# Patient Record
Sex: Female | Born: 1985 | Race: Black or African American | Hispanic: No | Marital: Single | Smoking: Never smoker
Health system: Southern US, Community
[De-identification: ages and names within clinical notes are randomized; demographics above are authoritative.]

## PROBLEM LIST (undated history)

## (undated) DIAGNOSIS — G43909 Migraine, unspecified, not intractable, without status migrainosus: Secondary | ICD-10-CM

## (undated) DIAGNOSIS — J45909 Unspecified asthma, uncomplicated: Secondary | ICD-10-CM

## (undated) DIAGNOSIS — A599 Trichomoniasis, unspecified: Secondary | ICD-10-CM

## (undated) DIAGNOSIS — B009 Herpesviral infection, unspecified: Secondary | ICD-10-CM

## (undated) HISTORY — DX: Trichomoniasis, unspecified: A59.9

## (undated) HISTORY — DX: Unspecified asthma, uncomplicated: J45.909

## (undated) HISTORY — DX: Herpesviral infection, unspecified: B00.9

## (undated) HISTORY — DX: Migraine, unspecified, not intractable, without status migrainosus: G43.909

## (undated) HISTORY — PX: NO PAST SURGERIES: SHX2092

---

## 2009-04-22 ENCOUNTER — Emergency Department (HOSPITAL_COMMUNITY): Admission: EM | Admit: 2009-04-22 | Discharge: 2009-04-23 | Payer: Self-pay | Admitting: Emergency Medicine

## 2016-05-28 LAB — OB RESULTS CONSOLE HGB/HCT, BLOOD
HEMATOCRIT: 12 %
Hemoglobin: 35.9 g/dL

## 2016-05-28 LAB — OB RESULTS CONSOLE HIV ANTIBODY (ROUTINE TESTING): HIV: NONREACTIVE

## 2016-05-28 LAB — OB RESULTS CONSOLE ANTIBODY SCREEN: Antibody Screen: NEGATIVE

## 2016-05-28 LAB — OB RESULTS CONSOLE ABO/RH: RH TYPE: POSITIVE

## 2016-05-28 LAB — OB RESULTS CONSOLE RPR: RPR: NONREACTIVE

## 2016-05-28 LAB — OB RESULTS CONSOLE HEPATITIS B SURFACE ANTIGEN: Hepatitis B Surface Ag: NEGATIVE

## 2016-05-30 LAB — OB RESULTS CONSOLE RUBELLA ANTIBODY, IGM: Rubella: IMMUNE

## 2016-05-30 LAB — URINE CULTURE: URINE CULTURE, ROUTINE: NEGATIVE

## 2016-05-30 LAB — OB RESULTS CONSOLE ABO/RH

## 2016-06-09 LAB — OB RESULTS CONSOLE GC/CHLAMYDIA
CHLAMYDIA, DNA PROBE: NEGATIVE
GC PROBE AMP, GENITAL: NEGATIVE

## 2016-07-16 DIAGNOSIS — O30049 Twin pregnancy, dichorionic/diamniotic, unspecified trimester: Secondary | ICD-10-CM | POA: Insufficient documentation

## 2016-07-16 DIAGNOSIS — D252 Subserosal leiomyoma of uterus: Secondary | ICD-10-CM

## 2016-07-16 DIAGNOSIS — Z8619 Personal history of other infectious and parasitic diseases: Secondary | ICD-10-CM | POA: Insufficient documentation

## 2016-07-17 ENCOUNTER — Encounter: Payer: Self-pay | Admitting: *Deleted

## 2016-07-23 ENCOUNTER — Ambulatory Visit (INDEPENDENT_AMBULATORY_CARE_PROVIDER_SITE_OTHER): Payer: Self-pay | Admitting: Obstetrics and Gynecology

## 2016-07-23 ENCOUNTER — Encounter: Payer: Self-pay | Admitting: Obstetrics and Gynecology

## 2016-07-23 VITALS — BP 116/69 | HR 75 | Ht 61.0 in | Wt 130.6 lb

## 2016-07-23 DIAGNOSIS — Z789 Other specified health status: Secondary | ICD-10-CM

## 2016-07-23 DIAGNOSIS — O30049 Twin pregnancy, dichorionic/diamniotic, unspecified trimester: Secondary | ICD-10-CM

## 2016-07-23 DIAGNOSIS — Z8619 Personal history of other infectious and parasitic diseases: Secondary | ICD-10-CM

## 2016-07-23 DIAGNOSIS — O0992 Supervision of high risk pregnancy, unspecified, second trimester: Secondary | ICD-10-CM

## 2016-07-23 DIAGNOSIS — O30042 Twin pregnancy, dichorionic/diamniotic, second trimester: Secondary | ICD-10-CM

## 2016-07-23 LAB — POCT URINALYSIS DIP (DEVICE)
BILIRUBIN URINE: NEGATIVE
GLUCOSE, UA: NEGATIVE mg/dL
Hgb urine dipstick: NEGATIVE
KETONES UR: NEGATIVE mg/dL
Leukocytes, UA: NEGATIVE
NITRITE: NEGATIVE
PH: 7 (ref 5.0–8.0)
Protein, ur: NEGATIVE mg/dL
Specific Gravity, Urine: 1.01 (ref 1.005–1.030)
Urobilinogen, UA: 0.2 mg/dL (ref 0.0–1.0)

## 2016-07-23 LAB — FOLATE: FOLATE: 17.7 ng/mL (ref >5.4)

## 2016-07-23 LAB — COMPREHENSIVE METABOLIC PANEL
ALBUMIN: 3.5 g/dL — AB (ref 3.6–5.1)
ALT: 17 U/L (ref 6–29)
AST: 23 U/L (ref 10–30)
Alkaline Phosphatase: 37 U/L (ref 33–115)
BUN: 4 mg/dL — ABNORMAL LOW (ref 7–25)
CALCIUM: 8.8 mg/dL (ref 8.6–10.2)
CHLORIDE: 105 mmol/L (ref 98–110)
CO2: 23 mmol/L (ref 20–31)
Creat: 0.46 mg/dL — ABNORMAL LOW (ref 0.50–1.10)
Glucose, Bld: 57 mg/dL — ABNORMAL LOW (ref 65–99)
Potassium: 3.6 mmol/L (ref 3.5–5.3)
Sodium: 138 mmol/L (ref 135–146)
Total Bilirubin: 0.6 mg/dL (ref 0.2–1.2)
Total Protein: 6.3 g/dL (ref 6.1–8.1)

## 2016-07-23 LAB — VITAMIN B12: VITAMIN B 12: 309 pg/mL (ref 200–1100)

## 2016-07-23 MED ORDER — ASPIRIN EC 81 MG PO TBEC: 81.0000 mg | DELAYED_RELEASE_TABLET | Freq: Every day | ORAL | 3 refills | Status: DC

## 2016-07-23 MED ORDER — FOLIC ACID 1 MG PO TABS: 1.0000 mg | ORAL_TABLET | Freq: Every day | ORAL | 3 refills | Status: DC

## 2016-07-23 NOTE — Progress Notes (Signed)
Last pap 2017 Patient has some concerns about HSV.

## 2016-07-23 NOTE — Progress Notes (Signed)
New OB Note  07/23/2016   Clinic: [facility]  Chief Complaint: NOB  Transfer of Care Patient: Yes, [facility]  History of Present Illness: Suzanne Hernandez is a 30 y.o. G1 @ 17/5 weeks ([place] 2/3, based on Patient's last menstrual period was 03/21/2016.=13wk u/s), with the above CC. Preg complicated by has Twin pregnancy, twins dichorionic and diamniotic; Subserous leiomyoma of uterus; History of herpes simplex type 2 infection; and Supervision of high risk pregnancy in second trimester on her problem list.   She has Positive signs or symptoms of nausea/vomiting of pregnancy but they are improving and she is able to take PO w/o difficulty She has Negative signs or symptoms of miscarriage or preterm labor She identifies Negative Zika risk factors for her and her partner  ROS: A 12-point review of systems was performed and negative, except as stated in the above HPI.  OBGYN History: As per HPI. OB History  Gravida Para Term Preterm AB Living  1            SAB TAB Ectopic Multiple Live Births               # Outcome Date GA Lbr Len/2nd Weight Sex Delivery Anes PTL Lv  1 Current               Any issues with any prior pregnancies: not applicable Any prior children are healthy, doing well, without any problems or issues: not applicable History of pap smears: Yes. Last pap smear 2017 per patient.   Past Medical History: Past Medical History:  Diagnosis Date  . Asthma   . Herpes simplex virus (HSV) infection   . Migraine   . Trichimoniasis     Past Surgical History: History reviewed. No pertinent surgical history.  Family History:  Family History  Problem Relation Age of Onset  . Cancer Maternal Grandmother   . Cancer Father   . Hypertension Mother    She denies any female cancers, bleeding or blood clotting disorders.  She denies any history of mental retardation, birth defects or genetic disorders in her or the FOB's history  Social History:  Social History    Social History  . Marital status: Single    Spouse name: N/A  . Number of children: N/A  . Years of education: N/A   Occupational History  . Not on file.   Social History Main Topics  . Smoking status: Never Smoker  . Smokeless tobacco: Never Used  . Alcohol use No  . Drug use: No  . Sexual activity: Yes   Other Topics Concern  . Not on file   Social History Narrative  . No narrative on file    Allergy: No Known Allergies  Health Maintenance:  Mammogram Up to Date: not applicable  Current Outpatient Medications: PNV  Physical Exam:   BP 116/69   Pulse 75   Ht 5' 1"  (1.549 m)   Wt 130 lb 9.6 oz (59.2 kg)   LMP 03/21/2016   BMI 24.68 kg/m  Body mass index is 24.68 kg/m. Fundal height: not applicable FHTs: normal x 2  General appearance: Well nourished, well developed female in no acute distress.  Abdomen: gravid, nttp  Laboratory: A pos/RI/rpr neg/hiv neg/hepB neg/UCx neg/[place] neg/U-a with trace protein  Imaging:  Normal NT scan and analytes x 2, di di twins Bedside u/s: IUP x . subj normal fluid, normal FHR, +FM x 2. Transabdominal cervical length 4cm (done incidentally during FHR u/s)  Assessment: pt  doing well  Plan: 1. Supervision of high risk pregnancy in second trimester Routine care - Pain Mgmt, Profile 6 Conf w/o mM, U - GC/Chlamydia Probe Amp - [place] MFM OB DETAIL ADDL GEST +14 WK; Future - [place] MFM OB DETAIL +14 WK; Future - Alpha fetoprotein, maternal  2. Twin pregnancy, twins dichorionic and diamniotic Baseline pre-x labs and early 1hr GCT. Recommend baby ASA to prevent pre-x and extra 37m FA. - Comp Met (CMET) - Protein / Creatinine Ratio, Urine - [place]FM OB DETAIL ADDL GEST +14 WK; Future - [place]FM OB DETAIL +14 WK; Future - Alpha fetoprotein, maternal - Glucose Tolerance, 1 HR (50g)  3. History of herpes simplex type 2 infection Pt hesistant to use later in pregnancy given no outbreak in >4-5 years. D/w her immunosupp with pregnancy  and to hopefully avoid outbreaks close to possible labor time. Can d/w pt later. Would start at 32wks given twin GA and risk for PTB  4. Vegan - Vitamin B12 - Folate  Problem list reviewed and updated.  Follow up in 3 weeks.  >50% of 25 min visit spent on counseling and coordination of care.     CDurene RomansMD Attending [facility]

## 2016-07-24 LAB — PAIN MGMT, PROFILE 6 CONF W/O MM, U
6 Acetylmorphine: NEGATIVE ng/mL (ref <10)
AMPHETAMINES: NEGATIVE ng/mL (ref <500)
Alcohol Metabolites: NEGATIVE ng/mL (ref <500)
BARBITURATES: NEGATIVE ng/mL (ref <300)
Benzodiazepines: NEGATIVE ng/mL (ref <100)
CREATININE: 5.9 mg/dL — AB (ref >=20.0)
Cocaine Metabolite: NEGATIVE ng/mL (ref <150)
METHADONE METABOLITE: NEGATIVE ng/mL (ref <100)
Marijuana Metabolite: NEGATIVE ng/mL (ref <20)
OPIATES: NEGATIVE ng/mL (ref <100)
OXIDANT: NEGATIVE ug/mL (ref <200)
Oxycodone: NEGATIVE ng/mL (ref <100)
PH: 6.46 (ref 4.5–9.0)
Phencyclidine: NEGATIVE ng/mL (ref <25)
Please note:: 0
Specific Gravity: 1.002 — ABNORMAL LOW (ref >=1.003)

## 2016-07-24 LAB — PROTEIN / CREATININE RATIO, URINE: Creatinine, Urine: 8 mg/dL — ABNORMAL LOW (ref 20–320)

## 2016-07-24 LAB — ALPHA FETOPROTEIN, MATERNAL
AFP: 151 ng/mL
Curr Gest Age: 17.7 weeks
MoM for AFP: 2.93
OPEN SPINA BIFIDA: NEGATIVE

## 2016-07-24 LAB — GLUCOSE TOLERANCE, 1 HOUR (50G) W/O FASTING: GLUCOSE, 1 HR, GESTATIONAL: 58 mg/dL — AB (ref <140)

## 2016-07-29 ENCOUNTER — Telehealth: Payer: Self-pay

## 2016-07-29 NOTE — Telephone Encounter (Signed)
Received call from patient regarding FMLA paperwork. Patient her employer is giving her a hard time with her being pregnant. She wanted to know if we could write her a note stating why she has missed work or being tardy. I explained to patient we cannot take her out of work unless it is medically necessary however she may be able to get an excused if she has missed work or been absent due to complications regarding  Pregnancy.I also explained to patient this can only be determine by her daughter. Patient verbalizes understanding at this time.

## 2016-08-07 ENCOUNTER — Ambulatory Visit (HOSPITAL_COMMUNITY)
Admission: RE | Admit: 2016-08-07 | Discharge: 2016-08-07 | Disposition: A | Discharge status: Home / Self Care | Payer: Medicaid Other | Source: Ambulatory Visit | Attending: Obstetrics and Gynecology | Admitting: Obstetrics and Gynecology

## 2016-08-07 ENCOUNTER — Encounter (HOSPITAL_COMMUNITY): Payer: Self-pay

## 2016-08-07 DIAGNOSIS — O30042 Twin pregnancy, dichorionic/diamniotic, second trimester: Secondary | ICD-10-CM | POA: Insufficient documentation

## 2016-08-07 DIAGNOSIS — Z3A19 19 weeks gestation of pregnancy: Secondary | ICD-10-CM | POA: Insufficient documentation

## 2016-08-07 DIAGNOSIS — Z36 Encounter for antenatal screening of mother: Secondary | ICD-10-CM | POA: Diagnosis not present

## 2016-08-07 DIAGNOSIS — O30049 Twin pregnancy, dichorionic/diamniotic, unspecified trimester: Secondary | ICD-10-CM

## 2016-08-07 DIAGNOSIS — O0992 Supervision of high risk pregnancy, unspecified, second trimester: Secondary | ICD-10-CM

## 2016-08-07 DIAGNOSIS — D252 Subserosal leiomyoma of uterus: Secondary | ICD-10-CM

## 2016-08-07 DIAGNOSIS — Z8619 Personal history of other infectious and parasitic diseases: Secondary | ICD-10-CM

## 2016-08-17 ENCOUNTER — Ambulatory Visit (INDEPENDENT_AMBULATORY_CARE_PROVIDER_SITE_OTHER): Payer: Medicaid Other | Admitting: Family

## 2016-08-17 ENCOUNTER — Telehealth: Payer: Self-pay | Admitting: *Deleted

## 2016-08-17 VITALS — BP 121/73 | HR 89 | Wt 140.3 lb

## 2016-08-17 DIAGNOSIS — O30042 Twin pregnancy, dichorionic/diamniotic, second trimester: Secondary | ICD-10-CM

## 2016-08-17 DIAGNOSIS — O98312 Other infections with a predominantly sexual mode of transmission complicating pregnancy, second trimester: Secondary | ICD-10-CM

## 2016-08-17 DIAGNOSIS — Z8619 Personal history of other infectious and parasitic diseases: Secondary | ICD-10-CM

## 2016-08-17 DIAGNOSIS — O0992 Supervision of high risk pregnancy, unspecified, second trimester: Secondary | ICD-10-CM

## 2016-08-17 DIAGNOSIS — A6000 Herpesviral infection of urogenital system, unspecified: Secondary | ICD-10-CM

## 2016-08-17 DIAGNOSIS — O09892 Supervision of other high risk pregnancies, second trimester: Secondary | ICD-10-CM

## 2016-08-17 DIAGNOSIS — O30049 Twin pregnancy, dichorionic/diamniotic, unspecified trimester: Secondary | ICD-10-CM

## 2016-08-17 MED ORDER — VALACYCLOVIR HCL 500 MG PO TABS: ORAL_TABLET | ORAL | 2 refills | Status: DC

## 2016-08-17 NOTE — Patient Instructions (Addendum)
Second Trimester of Pregnancy The second trimester is from week 13 through week 28, months 4 through 6. The second trimester is often a time when you feel your best. Your body has also adjusted to being pregnant, and you begin to feel better physically. Usually, morning sickness has lessened or quit completely, you may have more energy, and you may have an increase in appetite. The second trimester is also a time when the fetus is growing rapidly. At the end of the sixth month, the fetus is about 9 inches long and weighs about 1 pounds. You will likely begin to feel the baby move (quickening) between 18 and 20 weeks of the pregnancy. BODY CHANGES Your body goes through many changes during pregnancy. The changes vary from woman to woman.   Your weight will continue to increase. You will notice your lower abdomen bulging out.  You may begin to get stretch marks on your hips, abdomen, and breasts.  You may develop headaches that can be relieved by medicines approved by your health care provider.  You may urinate more often because the fetus is pressing on your bladder.  You may develop or continue to have heartburn as a result of your pregnancy.  You may develop constipation because certain hormones are causing the muscles that push waste through your intestines to slow down.  You may develop hemorrhoids or swollen, bulging veins (varicose veins).  You may have back pain because of the weight gain and pregnancy hormones relaxing your joints between the bones in your pelvis and as a result of a shift in weight and the muscles that support your balance.  Your breasts will continue to grow and be tender.  Your gums may bleed and may be sensitive to brushing and flossing.  Dark spots or blotches (chloasma, mask of pregnancy) may develop on your face. This will likely fade after the baby is born.  A dark line from your belly button to the pubic area (linea nigra) may appear. This will likely  fade after the baby is born.  You may have changes in your hair. These can include thickening of your hair, rapid growth, and changes in texture. Some women also have hair loss during or after pregnancy, or hair that feels dry or thin. Your hair will most likely return to normal after your baby is born. WHAT TO EXPECT AT YOUR PRENATAL VISITS During a routine prenatal visit:  You will be weighed to make sure you and the fetus are growing normally.  Your blood pressure will be taken.  Your abdomen will be measured to track your baby's growth.  The fetal heartbeat will be listened to.  Any test results from the previous visit will be discussed. Your health care provider may ask you:  How you are feeling.  If you are feeling the baby move.  If you have had any abnormal symptoms, such as leaking fluid, bleeding, severe headaches, or abdominal cramping.  If you are using any tobacco products, including cigarettes, chewing tobacco, and electronic cigarettes.  If you have any questions. Other tests that may be performed during your second trimester include:  Blood tests that check for:  Low iron levels (anemia).  Gestational diabetes (between 24 and 28 weeks).  Rh antibodies.  Urine tests to check for infections, diabetes, or protein in the urine.  An ultrasound to confirm the proper growth and development of the baby.  An amniocentesis to check for possible genetic problems.  Fetal screens for spina bifida   and Down syndrome.  HIV (human immunodeficiency virus) testing. Routine prenatal testing includes screening for HIV, unless you choose not to have this test. HOME CARE INSTRUCTIONS   Avoid all smoking, herbs, alcohol, and unprescribed drugs. These chemicals affect the formation and growth of the baby.  Do not use any tobacco products, including cigarettes, chewing tobacco, and electronic cigarettes. If you need help quitting, ask your health care provider. You may receive  counseling support and other resources to help you quit.  Follow your health care provider's instructions regarding medicine use. There are medicines that are either safe or unsafe to take during pregnancy.  Exercise only as directed by your health care provider. Experiencing uterine cramps is a good sign to stop exercising.  Continue to eat regular, healthy meals.  Wear a good support bra for breast tenderness.  Do not use hot tubs, steam rooms, or saunas.  Wear your seat belt at all times when driving.  Avoid raw meat, uncooked cheese, cat litter boxes, and soil used by cats. These carry germs that can cause birth defects in the baby.  Take your prenatal vitamins.  Take 1500-2000 mg of calcium daily starting at the 20th week of pregnancy until you deliver your baby.  Try taking a stool softener (if your health care provider approves) if you develop constipation. Eat more high-fiber foods, such as fresh vegetables or fruit and whole grains. Drink plenty of fluids to keep your urine clear or pale yellow.  Take warm sitz baths to soothe any pain or discomfort caused by hemorrhoids. Use hemorrhoid cream if your health care provider approves.  If you develop varicose veins, wear support hose. Elevate your feet for 15 minutes, 3-4 times a day. Limit salt in your diet.  Avoid heavy lifting, wear low heel shoes, and practice good posture.  Rest with your legs elevated if you have leg cramps or low back pain.  Visit your dentist if you have not gone yet during your pregnancy. Use a soft toothbrush to brush your teeth and be gentle when you floss.  A sexual relationship may be continued unless your health care provider directs you otherwise.  Continue to go to all your prenatal visits as directed by your health care provider. SEEK MEDICAL CARE IF:   You have dizziness.  You have mild pelvic cramps, pelvic pressure, or nagging pain in the abdominal area.  You have persistent nausea,  vomiting, or diarrhea.  You have a bad smelling vaginal discharge.  You have pain with urination. SEEK IMMEDIATE MEDICAL CARE IF:   You have a fever.  You are leaking fluid from your vagina.  You have spotting or bleeding from your vagina.  You have severe abdominal cramping or pain.  You have rapid weight gain or loss.  You have shortness of breath with chest pain.  You notice sudden or extreme swelling of your face, hands, ankles, feet, or legs.  You have not felt your baby move in over an hour.  You have severe headaches that do not go away with medicine.  You have vision changes.   This information is not intended to replace advice given to you by your health care provider. Make sure you discuss any questions you have with your health care provider.   Document Released: 11/03/2001 Document Revised: 11/30/2014 Document Reviewed: 01/10/2013 Elsevier Interactive Patient Education 2016 [place] [address] [place], [place]  [address] Phone -  [phone]   Fax - [phone]  [place] [address] [place], [place] [address] Phone - [phone]   Fax - [place] [address] [place], [place]  [address] Phone - [phone]   Fax - [phone]  [place], [place] [address] [place], [place]  [address] Phone - [phone]   Fax - [phone]  [place] [address] [place], [place]  [address] Phone - [phone]   Fax - [phone]  [facility] [id number] [place], [place]  [address] Phone - [phone]   Fax - [place] [address] [place], [place]  [address] Phone - [phone]   Fax - [phone]  [place] [address] [place], [place] [address] [place], [place]   [address] Phone - [phone]   Fax - [place] [address] [place], [place]  [address] Phone - [phone]   Fax - [phone] [facility] [place] [place], [place]  [address] Phone - [phone]   Fax - [phone]  [facility] [place] [address] [place], [place]  [address] Phone - [phone]   Fax - [phone]  [facility] [place], [place], [place]  [address] Phone - [phone]   Fax - [place] [address] [place], [place]  [address] Phone - [phone]   Fax - [phone]  [facility] [address] [place], [place]  [address] Phone - [place], [place]  [address] Phone - [phone]   Fax - [place] [address], [place], [place]  [address] Phone - [phone]   Fax - [phone]  [place] [address] [place], [place]  [address] Phone - [phone]   Fax - [place]. [place], [place]  [address] Phone - [phone]   Fax - [place], [place], [place]  [address] Phone - [phone]   Fax - [place] [address], [place], [place]  [address] Phone - [phone]   Fax - [phone]  [facility] [address], [place], [place]  [address] Phone - [phone]   Fax - [place], [place], [place]  [address] Phone - [phone]   Fax - [phone]  [place] [address] [place], [place]  [address] Phone - [phone]   Fax - [phone] [place] [id number] [place], [place]   [address] Phone - [phone]   Fax - [place] [address] [place], [place]  [address] Phone - [phone]   Fax - [place] [address], [place],   [address] Phone - [phone]   Fax - [phone]

## 2016-08-17 NOTE — Telephone Encounter (Signed)
Patient left leave paperwork to be filled out and signed. Paperwork complete, patient needs to return to pick up papers, sign ROI. Called patient and left message to return at her convenience.

## 2016-08-17 NOTE — [place] Notes (Signed)
C/o yesterday while sitting felt like urine came out- but states" I know it wasn't urine"- states was clear. Declines flu shot.

## 2016-08-17 NOTE — [place] Notes (Signed)
   PRENATAL VISIT NOTE  Subjective:  Suzanne Hernandez is a 30 y.o. G1P0 at [redacted]w[redacted]d being seen today for ongoing prenatal care.  She is currently monitored for the following issues for this high-risk pregnancy and has Twin pregnancy, twins dichorionic and diamniotic; Subserous leiomyoma of uterus; History of herpes simplex type 2 infection; and Supervision of high risk pregnancy in second trimester on her problem list.  Patient reports reports feeling a wet discharge when sitting down yesterday x 1.  Has not felt discharge since.  Also feeling a  tingling on vaginal area, concerned about a possible HSV outbreak.  Contractions: Not present. Vag. Bleeding: None.  Movement: Present. Denies leaking of fluid.   The following portions of the patient's history were reviewed and updated as appropriate: allergies, current medications, past family history, past medical history, past social history, past surgical history and problem list. Problem list updated.  Objective:   Vitals:   08/17/16 0816  BP: 121/73  Pulse: 89  Weight: 140 lb 4.8 oz (63.6 kg)    Fetal Status: Fetal Heart Rate (bpm): 140/130 Fundal Height: 32 cm Movement: Present     General:  Alert, oriented and cooperative. Patient is in no acute distress.  Skin: Skin is warm and dry. No rash noted.   Cardiovascular: Normal heart rate noted  Respiratory: Normal respiratory effort, no problems with respiration noted  Abdomen: Soft, gravid, appropriate for gestational age. Pain/Pressure: Present     Pelvic:  Cervical exam deferred      Neg pooling, neg ferning; no active lesions seen on vaginal area  Extremities: Normal range of motion.  Edema: None  Mental Status: Normal mood and affect. Normal behavior. Normal judgment and thought content.   Urinalysis:      Assessment and Plan:  Pregnancy: G1P0 at [redacted]w[redacted]d  1. Supervision of high risk pregnancy in second trimester - Reviewed anatomy ultrasounds - growth 49%ile x 2  2.  Twin pregnancy,  twins dichorionic and diamniotic - Follow-up growth ultrasound scheduled for 09/04/16  3. Herpes genitalia - valACYclovir (VALTREX) 500 MG tablet; Take one pill every 12 hours x 3 days for outbreak  Dispense: 60 tablet; Refill: 2  Preterm labor symptoms and general obstetric precautions including but not limited to vaginal bleeding, contractions, leaking of fluid and fetal movement were reviewed in detail with the patient. Please refer to After Visit Summary for other counseling recommendations.  Return in 3 weeks (on 09/07/2016).  Venia Carbon Michiel Cowboy, CNM

## 2016-09-04 ENCOUNTER — Ambulatory Visit (HOSPITAL_COMMUNITY)
Admission: RE | Admit: 2016-09-04 | Discharge: 2016-09-04 | Disposition: A | Discharge status: Home / Self Care | Payer: Medicaid Other | Source: Ambulatory Visit | Attending: Obstetrics and Gynecology | Admitting: Obstetrics and Gynecology

## 2016-09-04 ENCOUNTER — Encounter (HOSPITAL_COMMUNITY): Payer: Self-pay

## 2016-09-04 DIAGNOSIS — O30042 Twin pregnancy, dichorionic/diamniotic, second trimester: Secondary | ICD-10-CM | POA: Insufficient documentation

## 2016-09-04 DIAGNOSIS — Z8619 Personal history of other infectious and parasitic diseases: Secondary | ICD-10-CM

## 2016-09-04 DIAGNOSIS — Z3A23 23 weeks gestation of pregnancy: Secondary | ICD-10-CM | POA: Diagnosis not present

## 2016-09-04 DIAGNOSIS — O30049 Twin pregnancy, dichorionic/diamniotic, unspecified trimester: Secondary | ICD-10-CM

## 2016-09-04 DIAGNOSIS — Z362 Encounter for other antenatal screening follow-up: Secondary | ICD-10-CM | POA: Insufficient documentation

## 2016-09-04 DIAGNOSIS — D252 Subserosal leiomyoma of uterus: Secondary | ICD-10-CM

## 2016-09-07 ENCOUNTER — Other Ambulatory Visit (HOSPITAL_COMMUNITY): Payer: Self-pay | Admitting: *Deleted

## 2016-09-07 DIAGNOSIS — O30049 Twin pregnancy, dichorionic/diamniotic, unspecified trimester: Secondary | ICD-10-CM

## 2016-09-08 ENCOUNTER — Encounter: Payer: Self-pay | Admitting: Advanced Practice Midwife

## 2016-09-08 ENCOUNTER — Ambulatory Visit (INDEPENDENT_AMBULATORY_CARE_PROVIDER_SITE_OTHER): Payer: Medicaid Other | Admitting: Advanced Practice Midwife

## 2016-09-08 DIAGNOSIS — O30042 Twin pregnancy, dichorionic/diamniotic, second trimester: Secondary | ICD-10-CM | POA: Diagnosis not present

## 2016-09-08 DIAGNOSIS — O0992 Supervision of high risk pregnancy, unspecified, second trimester: Secondary | ICD-10-CM

## 2016-09-08 NOTE — Patient Instructions (Signed)
AREA PEDIATRIC/FAMILY PRACTICE PHYSICIANS  [facility] [address] [place], [place]  [address] Phone - [phone]   Fax - [phone]  [facility] [address] [place], [place] [address] Phone - [phone]   Fax - [phone]  [facility] [address] [place], [place]  [address] Phone - [phone]   Fax - [phone]  [facility] [address] [place], [place]  [address] Phone - [phone]   Fax - [phone]  [facility] [address] [place], [place]  [address] Phone - [phone]   Fax - [phone]  [facility] [address] [place], [place]  [address] Phone - [phone]   Fax - [phone]  [facility] [place] [address] [place], [place]  [address] Phone - [phone]   Fax - [phone]  [facility] [address] [place], [place]  [address] Phone - [phone]   Fax - [phone]  [facility] [address] [place], [place]  [address] Phone - [phone]   Fax - [phone] [facility] [address] [place], [place]  [address] Phone - [phone]   Fax - [phone]  [facility] [address] [place], [place]  [address] Phone - [phone]   Fax - [phone]  [facility] [address] [place], [place]  [address] Phone - [phone]   Fax - [phone]  [facility] [address] [place], [place]  [address] Phone - [phone]   Fax - [phone]  [facility] GOSR[facility] [address] [place], [place]  [address] Phone - [phone]  [facility] [address] [place], [place]  [address] Phone - [phone]   Fax - [phone]  [facility] [address] [place], [place]  [address] Phone - [phone]   Fax - [phone]  [facility] [address] [place], [place]  [address] Phone - [phone]   Fax - [phone]  [facility] [address] [place], [place]  [address] Phone - [phone]   Fax - [phone]   [facility] [address] [place], [place]  [address] Phone - [phone]   Fax - [phone]  [facility] [address] [place], [place]  [address] Phone - [phone]   Fax - [phone]  [facility] RUB[facility] [address] [place], [place]  [address] Phone - [phone]   Fax - [phone]  [facility] [address] [place], [place]  [address] Phone - [phone]   Fax - [phone]  [facility] [address] , [place]  [address] Phone - [phone]   Fax - [phone] [place] [facility] [address] [place], [place]  [address] Phone - [phone]   Fax - [phone]  [place] [facility] [address] [place], [place]  [address] Phone - [phone]   Fax - [phone]   [facility] - [place] [address] [place], [place]  [address] Phone - [phone]   Fax - [phone]   

## 2016-09-08 NOTE — [place] Notes (Signed)
Home Medicaid Form Completed  

## 2016-09-09 NOTE — [place] Notes (Signed)
   PRENATAL VISIT NOTE  Subjective:  Suzanne Hernandez is a 30 y.o. G1P0 at [redacted]w[redacted]d being seen today for ongoing prenatal care.  She is currently monitored for the following issues for this high-risk pregnancy and has Twin pregnancy, twins dichorionic and diamniotic; Subserous leiomyoma of uterus; History of herpes simplex type 2 infection; and Supervision of high risk pregnancy in second trimester on her problem list.  Patient reports constant backache when working and on her feet for prolonged period of time. Works at a [employer], boxes, in on her feet constantly. Asking FMLA to be out of work for discomforts of pregnancy. Contractions: Not present. Vag. Bleeding: None.  Movement: Present. Denies leaking of fluid.   The following portions of the patient's history were reviewed and updated as appropriate: allergies, current medications, past family history, past medical history, past social history, past surgical history and problem list. Problem list updated.  Objective:   Vitals:   09/08/16 1150  BP: 119/70  Pulse: 98  Weight: 146 lb 9.6 oz (66.5 kg)    Fetal Status: Fetal Heart Rate (bpm): 130/138   Movement: Present     General:  Alert, oriented and cooperative. Patient is in no acute distress.  Skin: Skin is warm and dry. No rash noted.   Cardiovascular: Normal heart rate noted  Respiratory: Normal respiratory effort, no problems with respiration noted  Abdomen: Soft, gravid, appropriate for gestational age. Pain/Pressure: Present     Pelvic:  Cervical exam deferred        Extremities: Normal range of motion.  Edema: None  Mental Status: Normal mood and affect. Normal behavior. Normal judgment and thought content.   Assessment and Plan:  Pregnancy: G1P0 at [redacted]w[redacted]d  1. Dichorionic diamniotic twin pregnancy in second trimester   Preterm labor symptoms and general obstetric precautions including but not limited to vaginal bleeding, contractions, leaking of fluid and fetal  movement were reviewed in detail with the patient. Please refer to After Visit Summary for other counseling recommendations.  Discussed that her discomforts do not likely qualify her for FMLA. Offered note requesting that she limit lifting, be able to sit when possible and have frequent bathroom breaks. Also recommended that she use a maternity support belt and discuss her work leave policy w/ HR. Discussed that although her tasks at work may be uncomfortable they are not any danger to the pt or her babies. Discussed normal discomforts of pregnancy vs red flags.  Return in about 4 weeks (around 10/06/2016) for ROB/GTT.  Manya Silvas, CNM

## 2016-10-02 ENCOUNTER — Ambulatory Visit (HOSPITAL_COMMUNITY)
Admission: RE | Admit: 2016-10-02 | Discharge: 2016-10-02 | Disposition: A | Discharge status: Home / Self Care | Payer: Medicaid Other | Source: Ambulatory Visit | Attending: Obstetrics and Gynecology | Admitting: Obstetrics and Gynecology

## 2016-10-02 ENCOUNTER — Other Ambulatory Visit (HOSPITAL_COMMUNITY): Payer: Self-pay | Admitting: Maternal and Fetal Medicine

## 2016-10-02 ENCOUNTER — Encounter (HOSPITAL_COMMUNITY): Payer: Self-pay

## 2016-10-02 DIAGNOSIS — O30042 Twin pregnancy, dichorionic/diamniotic, second trimester: Secondary | ICD-10-CM | POA: Insufficient documentation

## 2016-10-02 DIAGNOSIS — O30049 Twin pregnancy, dichorionic/diamniotic, unspecified trimester: Secondary | ICD-10-CM

## 2016-10-02 DIAGNOSIS — Z3689 Encounter for other specified antenatal screening: Secondary | ICD-10-CM | POA: Diagnosis present

## 2016-10-02 DIAGNOSIS — Z3A27 27 weeks gestation of pregnancy: Secondary | ICD-10-CM

## 2016-10-05 ENCOUNTER — Other Ambulatory Visit (HOSPITAL_COMMUNITY): Payer: Self-pay | Admitting: *Deleted

## 2016-10-05 DIAGNOSIS — O30043 Twin pregnancy, dichorionic/diamniotic, third trimester: Secondary | ICD-10-CM

## 2016-10-06 ENCOUNTER — Encounter: Payer: Medicaid Other | Admitting: Obstetrics & Gynecology

## 2016-10-08 ENCOUNTER — Telehealth: Payer: Self-pay | Admitting: General Practice

## 2016-10-08 ENCOUNTER — Ambulatory Visit (INDEPENDENT_AMBULATORY_CARE_PROVIDER_SITE_OTHER): Payer: Medicaid Other | Admitting: Advanced Practice Midwife

## 2016-10-08 ENCOUNTER — Other Ambulatory Visit (HOSPITAL_COMMUNITY)
Admission: RE | Admit: 2016-10-08 | Discharge: 2016-10-08 | Disposition: A | Discharge status: Home / Self Care | Payer: Medicaid Other | Source: Ambulatory Visit | Attending: Advanced Practice Midwife | Admitting: Advanced Practice Midwife

## 2016-10-08 VITALS — BP 118/76 | HR 108 | Wt 153.0 lb

## 2016-10-08 DIAGNOSIS — O0992 Supervision of high risk pregnancy, unspecified, second trimester: Secondary | ICD-10-CM

## 2016-10-08 DIAGNOSIS — N898 Other specified noninflammatory disorders of vagina: Secondary | ICD-10-CM

## 2016-10-08 DIAGNOSIS — O30043 Twin pregnancy, dichorionic/diamniotic, third trimester: Secondary | ICD-10-CM

## 2016-10-08 DIAGNOSIS — Z8619 Personal history of other infectious and parasitic diseases: Secondary | ICD-10-CM

## 2016-10-08 DIAGNOSIS — O26893 Other specified pregnancy related conditions, third trimester: Secondary | ICD-10-CM

## 2016-10-08 DIAGNOSIS — Z113 Encounter for screening for infections with a predominantly sexual mode of transmission: Secondary | ICD-10-CM | POA: Diagnosis not present

## 2016-10-08 LAB — FETAL FIBRONECTIN: Fetal Fibronectin: NEGATIVE

## 2016-10-08 LAB — WET PREP, GENITAL
Trich, Wet Prep: NONE SEEN
Yeast Wet Prep HPF POC: NONE SEEN

## 2016-10-08 NOTE — [place] Notes (Signed)
Received STAT results for FFN on pt from [facility], resulting negative.  Notified Dr. Elly Modena of results.  No new orders.

## 2016-10-08 NOTE — Patient Instructions (Signed)
. °Preterm Labor and Birth Information °The normal length of a pregnancy is 39-41 weeks. Preterm labor is when labor starts before 37 completed weeks of pregnancy. °What are the risk factors for preterm labor? °Preterm labor is more likely to occur in women who: °· Have certain infections during pregnancy such as a bladder infection, sexually transmitted infection, or infection inside the uterus (chorioamnionitis). °· Have a shorter-than-normal cervix. °· Have gone into preterm labor before. °· Have had surgery on their cervix. °· Are younger than age 17 or older than age 35. °· Are African American. °· Are pregnant with twins or multiple babies (multiple gestation). °· Take street drugs or smoke while pregnant. °· Do not gain enough weight while pregnant. °· Became pregnant shortly after having been pregnant. °What are the symptoms of preterm labor? °Symptoms of preterm labor include: °· Cramps similar to those that can happen during a menstrual period. The cramps may happen with diarrhea. °· Pain in the abdomen or lower back. °· Regular uterine contractions that may feel like tightening of the abdomen. °· A feeling of increased pressure in the pelvis. °· Increased watery or bloody mucus discharge from the vagina. °· Water breaking (ruptured amniotic sac). °Why is it important to recognize signs of preterm labor? °It is important to recognize signs of preterm labor because babies who are born prematurely may not be fully developed. This can put them at an increased risk for: °· Long-term (chronic) heart and lung problems. °· Difficulty immediately after birth with regulating body systems, including blood sugar, body temperature, heart rate, and breathing rate. °· Bleeding in the brain. °· Cerebral palsy. °· Learning difficulties. °· Death. °These risks are highest for babies who are born before 34 weeks of pregnancy. °How is preterm labor treated? °Treatment depends on the length of your pregnancy, your condition,  and the health of your baby. It may involve: °· Having a stitch (suture) placed in your cervix to prevent your cervix from opening too early (cerclage). °· Taking or being given medicines, such as: °¨ Hormone medicines. These may be given early in pregnancy to help support the pregnancy. °¨ Medicine to stop contractions. °¨ Medicines to help mature the baby’s lungs. These may be prescribed if the risk of delivery is high. °¨ Medicines to prevent your baby from developing cerebral palsy. °If the labor happens before 34 weeks of pregnancy, you may need to stay in the hospital. °What should I do if I think I am in preterm labor? °If you think that you are going into preterm labor, call your health care provider right away. °How can I prevent preterm labor in future pregnancies? °To increase your chance of having a full-term pregnancy: °· Do not use any tobacco products, such as cigarettes, chewing tobacco, and e-cigarettes. If you need help quitting, ask your health care provider. °· Do not use street drugs or medicines that have not been prescribed to you during your pregnancy. °· Talk with your health care provider before taking any herbal supplements, even if you have been taking them regularly. °· Make sure you gain a healthy amount of weight during your pregnancy. °· Watch for infection. If you think that you might have an infection, get it checked right away. °· Make sure to tell your health care provider if you have gone into preterm labor before. °This information is not intended to replace advice given to you by your health care provider. Make sure you discuss any questions you have with your   health care provider. °Document Released: 01/30/2004 Document Revised: 04/21/2016 Document Reviewed: 04/01/2016 °Elsevier Interactive Patient Education © 2017 Elsevier Inc. ° °

## 2016-10-08 NOTE — Telephone Encounter (Signed)
Opened in error

## 2016-10-08 NOTE — Progress Notes (Signed)
   PRENATAL VISIT NOTE  Subjective:  Suzanne Hernandez is a 30 y.o. G1P0 at [redacted]w[redacted]d being seen today for ongoing prenatal care.  She is currently monitored for the following issues for this high-risk pregnancy and has Twin pregnancy, twins dichorionic and diamniotic; Subserous leiomyoma of uterus; History of herpes simplex type 2 infection; and Supervision of high risk pregnancy in second trimester on her problem list.  Patient reports painful contractions last night and two episodes of dampness in her underwear this morning. .  Contractions: Irritability. Vag. Bleeding: None.  Movement: Present. Denies leaking of fluid.   The following portions of the patient's history were reviewed and updated as appropriate: allergies, current medications, past family history, past medical history, past social history, past surgical history and problem list. Problem list updated.  Objective:   Vitals:   10/08/16 1005  BP: 118/76  Pulse: (!) 108  Weight: 153 lb (69.4 kg)    Fetal Status: Fetal Heart Rate (bpm): 140/143   Movement: Present  Presentation: Vertex  General:  Alert, oriented and cooperative. Patient is in no acute distress.  Skin: Skin is warm and dry. No rash noted.   Cardiovascular: Normal heart rate noted  Respiratory: Normal respiratory effort, no problems with respiration noted  Abdomen: Soft, gravid, appropriate for gestational age. Pain/Pressure: Present     Pelvic:  Cervical exam performed Dilation: Closed Effacement (%): 50 Station: -3. Neg pool, neg fern  Extremities: Normal range of motion.  Edema: None  Mental Status: Normal mood and affect. Normal behavior. Normal judgment and thought content.   Assessment and Plan:  Pregnancy: G1P0 at [redacted]w[redacted]d  1. Supervision of high risk pregnancy in second trimester  - Tdap vaccine greater than or equal to 7yo IM - Wet prep, genital - POCT [place] - GC/Chlamydia probe amp ([place])not at [facility] - Fetal fibronectin  2. Dichorionic  diamniotic twin pregnancy in third trimester  - Tdap vaccine greater than or equal to 7yo IM - Wet prep, genital - POCT [place] - GC/Chlamydia probe amp ([place])not at [facility] - Fetal fibronectin  3. History of herpes simplex type 2 infection  - Tdap vaccine greater than or equal to 7yo IM  4. Vaginal discharge during pregnancy in third trimester  - Wet prep, genital - POCT [place] - GC/Chlamydia probe amp ([place])not at [facility] - Fetal fibronectin  Preterm labor symptoms and general obstetric precautions including but not limited to vaginal bleeding, contractions, leaking of fluid and fetal movement were reviewed in detail with the patient. Please refer to After Visit Summary for other counseling recommendations.  F/U 2 weeks ROB/2 hour GTT ([place])   Manya Silvas, CNM

## 2016-10-08 NOTE — Progress Notes (Signed)
Patient reports contractions that were very painful that kept her up all night last night and into this morning. Patient states when the contractions started she noticed a watery wet spot on her nightgown. Patient states the pain/contractions settled down once she got up and moving this morning.

## 2016-10-08 NOTE — Telephone Encounter (Signed)
Per Manya Silvas, patient's FFN was negative. Called patient, no answer- left message stating we are trying to reach you with results please call us back at the clinics. Will send letter.

## 2016-10-09 LAB — GC/CHLAMYDIA PROBE AMP (~~LOC~~) NOT AT ARMC
CHLAMYDIA, DNA PROBE: NEGATIVE
Neisseria Gonorrhea: NEGATIVE

## 2016-10-12 ENCOUNTER — Other Ambulatory Visit: Payer: Medicaid Other

## 2016-10-12 ENCOUNTER — Telehealth: Payer: Self-pay | Admitting: *Deleted

## 2016-10-12 NOTE — Telephone Encounter (Signed)
Suzanne Hernandez left a message 10/09/16 am stating she is calling back for results of her preterm labor test.   I called and left a message I am returning her call- please call office. If we may leave detailed information on your voicemail- please let us know that in your message.

## 2016-10-14 ENCOUNTER — Other Ambulatory Visit: Payer: Medicaid Other

## 2016-10-14 DIAGNOSIS — O0993 Supervision of high risk pregnancy, unspecified, third trimester: Secondary | ICD-10-CM

## 2016-10-14 LAB — CBC
HCT: 32.3 % — ABNORMAL LOW (ref 35.0–45.0)
Hemoglobin: 10.4 g/dL — ABNORMAL LOW (ref 11.7–15.5)
MCH: 29 pg (ref 27.0–33.0)
MCHC: 32.2 g/dL (ref 32.0–36.0)
MCV: 90 fL (ref 80.0–100.0)
MPV: 12.1 fL (ref 7.5–12.5)
PLATELETS: 134 10*3/uL — AB (ref 140–400)
RBC: 3.59 MIL/uL — AB (ref 3.80–5.10)
RDW: 15 % (ref 11.0–15.0)
WBC: 8.6 10*3/uL (ref 3.8–10.8)

## 2016-10-15 LAB — GLUCOSE TOLERANCE, 2 HOURS W/ 1HR
GLUCOSE, 2 HOUR: 52 mg/dL — AB (ref <140)
Glucose, 1 hour: 84 mg/dL
Glucose, Fasting: 66 mg/dL (ref 65–99)

## 2016-10-15 LAB — RPR

## 2016-10-15 LAB — HIV ANTIBODY (ROUTINE TESTING W REFLEX): HIV: NONREACTIVE

## 2016-10-16 ENCOUNTER — Other Ambulatory Visit: Payer: Self-pay | Admitting: Advanced Practice Midwife

## 2016-10-16 DIAGNOSIS — B9689 Other specified bacterial agents as the cause of diseases classified elsewhere: Secondary | ICD-10-CM

## 2016-10-16 DIAGNOSIS — N760 Acute vaginitis: Principal | ICD-10-CM

## 2016-10-16 MED ORDER — METRONIDAZOLE 500 MG PO TABS: 500.0000 mg | ORAL_TABLET | Freq: Two times a day (BID) | ORAL | 0 refills | Status: DC

## 2016-10-16 NOTE — Progress Notes (Signed)
Dx BV. Rx Flagyl.

## 2016-10-19 ENCOUNTER — Encounter: Payer: Self-pay | Admitting: *Deleted

## 2016-10-19 NOTE — Progress Notes (Signed)
I called Jeff and left a message we are trying to call you with some information- please call our office ( also see previous phone call- she was trying to find out results of FFN which was negative).

## 2016-10-19 NOTE — Telephone Encounter (Signed)
I called Suzanne Hernandez and left a message I am trying to return your call. Please call office.

## 2016-10-21 ENCOUNTER — Telehealth: Payer: Self-pay | Admitting: *Deleted

## 2016-10-21 NOTE — Telephone Encounter (Signed)
Patient left message, wanting results from last visit. Please return call.

## 2016-10-21 NOTE — Progress Notes (Signed)
Suzanne Hernandez called and left a message she is returning our call. I called and left a message we are returning her call- since we keep missing each other- please leave a message if you are comfortable with [place] leaving detailed information on voicemail.

## 2016-10-22 NOTE — Progress Notes (Signed)
Suzanne Hernandez left message on 11/39 @ 1327 stating that a detailed message could be left regarding her test results.  I called this morning and left results of the following tests: From 11/16 - FFN, GC/CT and wet prep showing BV - Rx has been sent to her pharmacy. From 11/22 - 2hr GTT, HIV, CBC and RPR. She may call back if she has any questions.

## 2016-10-28 ENCOUNTER — Encounter: Payer: Self-pay | Admitting: Student

## 2016-10-28 ENCOUNTER — Ambulatory Visit (INDEPENDENT_AMBULATORY_CARE_PROVIDER_SITE_OTHER): Payer: Medicaid Other | Admitting: Student

## 2016-10-28 VITALS — BP 108/63 | HR 110 | Wt 160.0 lb

## 2016-10-28 DIAGNOSIS — O0992 Supervision of high risk pregnancy, unspecified, second trimester: Secondary | ICD-10-CM

## 2016-10-28 DIAGNOSIS — O30043 Twin pregnancy, dichorionic/diamniotic, third trimester: Secondary | ICD-10-CM

## 2016-10-28 DIAGNOSIS — Z8619 Personal history of other infectious and parasitic diseases: Secondary | ICD-10-CM

## 2016-10-28 MED ORDER — VALACYCLOVIR HCL 500 MG PO TABS: 500.0000 mg | ORAL_TABLET | Freq: Two times a day (BID) | ORAL | 6 refills | Status: DC

## 2016-10-28 NOTE — Progress Notes (Signed)
   PRENATAL VISIT NOTE  Subjective:  Suzanne Hernandez is a 30 y.o. G1P0 at [redacted]w[redacted]d being seen today for ongoing prenatal care.  She is currently monitored for the following issues for this high-risk pregnancy and has Twin pregnancy, twins dichorionic and diamniotic; Subserous leiomyoma of uterus; History of herpes simplex type 2 infection; and Supervision of high risk pregnancy in second trimester on her problem list.  Patient reports no bleeding, no leaking and occasional contractions.  Contractions: Irregular. Vag. Bleeding: None.  Movement: Present. Denies leaking of fluid.   The following portions of the patient's history were reviewed and updated as appropriate: allergies, current medications, past family history, past medical history, past social history, past surgical history and problem list. Problem list updated.  Objective:   Vitals:   10/28/16 1117  BP: 108/63  Pulse: (!) 110  Weight: 160 lb (72.6 kg)    Fetal Status: Fetal Heart Rate (bpm): 130 /142   Movement: Present   Fundal height 39 cm  General:  Alert, oriented and cooperative. Patient is in no acute distress.  Skin: Skin is warm and dry. No rash noted.   Cardiovascular: Normal heart rate noted  Respiratory: Normal respiratory effort, no problems with respiration noted  Abdomen: Soft, gravid, appropriate for gestational age. Pain/Pressure: Absent     Pelvic:  Cervical exam deferred        Extremities: Normal range of motion.  Edema: Trace  Mental Status: Normal mood and affect. Normal behavior. Normal judgment and thought content.   Assessment and Plan:  Pregnancy: G1P0 at [redacted]w[redacted]d  1. Dichorionic diamniotic twin pregnancy in third trimester -Pt interested in birth plan. Will write down delivery preferences to bring to next visit  2. Supervision of high risk pregnancy in second trimester -F/u ultrasound on Friday. Will start antenatal testing at 35 wks  3. History of herpes simplex type 2 infection  - valACYclovir  (VALTREX) 500 MG tablet; Take 1 tablet (500 mg total) by mouth 2 (two) times daily.  Dispense: 60 tablet; Refill: 6  Preterm labor symptoms and general obstetric precautions including but not limited to vaginal bleeding, contractions, leaking of fluid and fetal movement were reviewed in detail with the patient. Please refer to After Visit Summary for other counseling recommendations.  Return in about 2 weeks (around 11/11/2016) for High Risk OB.   Jorje Guild, NP

## 2016-10-28 NOTE — Progress Notes (Signed)
  PRENATAL VISIT NOTE  Subjective:  Suzanne Hernandez is a 30 y.o. G1P0 at [redacted]w[redacted]d being seen today for ongoing prenatal care.  She is currently monitored for the following issues for this high-risk pregnancy and has Twin pregnancy, twins dichorionic and diamniotic; Subserous leiomyoma of uterus; History of herpes simplex type 2 infection; and Supervision of high risk pregnancy in second trimester on her problem list.  Patient reports occasional pelvic cramps. Contractions: only intermittent pelvic cramping with activity relieved with rest. Vag. Bleeding: None.  Movement: Present. Denies leaking of fluid. Denies headache and visual changes.  The following portions of the patient's history were reviewed and updated as appropriate: allergies, current medications. Problem list up to date.  Objective:   Vitals:   10/28/16 1117  BP: 108/63  Pulse: (!) 110  Weight: 160 lb (72.6 kg)    Fetal Status: Fetal Heart Rate (bpm): 130 /142   Movement: Present     General:  Alert, oriented and cooperative. Patient is in no acute distress.  Skin: Skin is warm and dry. No rash noted.   Cardiovascular: Normal heart rate noted  Respiratory: Normal respiratory effort, no problems with respiration noted  Abdomen: Soft, gravid, appropriate for gestational age. Pain/Pressure: Absent     Pelvic:  Cervical exam deferred        Extremities: Normal range of motion.  Edema: Trace  Mental Status: Normal mood and affect. Normal behavior. Normal judgment and thought content.   Assessment and Plan:  Pregnancy: G1P0 at [redacted]w[redacted]d  1. Dichorionic diamniotic twin pregnancy in third trimester - good FHR for both twins and + movement today - [place] MFM OB scheduled for 10/30/16  2. Supervision of high risk pregnancy in second trimester - 10/14/16 RPR, HIV antibody negative, 2hr GTT <140 and CBC unremarkable   - will continue previously prescribed metronidazole - advised patient to start Valtrex 500 mg bid at 32w gestation for  prophylaxis  Preterm labor symptoms and general obstetric precautions including but not limited to vaginal bleeding, contractions, leaking of fluid and fetal movement were reviewed in detail with the patient. Advised patient that she could bring her birth plan to her next visit. Please refer to After Visit Summary for other counseling recommendations.  Return in about 2 weeks (around 11/11/2016) for Routine OB.   Kaven Cumbie Priest, Medical Student  [facility]

## 2016-10-28 NOTE — Progress Notes (Signed)
C/o hands puffy when she wakes up and it goes away as the day progresses.

## 2016-10-28 NOTE — Patient Instructions (Addendum)
Start valtrex this Saturday. Take 500 mg twice a day     Preterm Labor and Birth Information The normal length of a pregnancy is 39-41 weeks. Preterm labor is when labor starts before 37 completed weeks of pregnancy. What are the risk factors for preterm labor? Preterm labor is more likely to occur in women who:  Have certain infections during pregnancy such as a bladder infection, sexually transmitted infection, or infection inside the uterus (chorioamnionitis).  Have a shorter-than-normal cervix.  Have gone into preterm labor before.  Have had surgery on their cervix.  Are younger than age 61 or older than age 55.  Are African American.  Are pregnant with twins or multiple babies (multiple gestation).  Take street drugs or smoke while pregnant.  Do not gain enough weight while pregnant.  Became pregnant shortly after having been pregnant. What are the symptoms of preterm labor? Symptoms of preterm labor include:  Cramps similar to those that can happen during a menstrual period. The cramps may happen with diarrhea.  Pain in the abdomen or lower back.  Regular uterine contractions that may feel like tightening of the abdomen.  A feeling of increased pressure in the pelvis.  Increased watery or bloody mucus discharge from the vagina.  Water breaking (ruptured amniotic sac). Why is it important to recognize signs of preterm labor? It is important to recognize signs of preterm labor because babies who are born prematurely may not be fully developed. This can put them at an increased risk for:  Long-term (chronic) heart and lung problems.  Difficulty immediately after birth with regulating body systems, including blood sugar, body temperature, heart rate, and breathing rate.  Bleeding in the brain.  Cerebral palsy.  Learning difficulties.  Death. These risks are highest for babies who are born before 71 weeks of pregnancy. How is preterm labor  treated? Treatment depends on the length of your pregnancy, your condition, and the health of your baby. It may involve:  Having a stitch (suture) placed in your cervix to prevent your cervix from opening too early (cerclage).  Taking or being given medicines, such as:  Hormone medicines. These may be given early in pregnancy to help support the pregnancy.  Medicine to stop contractions.  Medicines to help mature the baby's lungs. These may be prescribed if the risk of delivery is high.  Medicines to prevent your baby from developing cerebral palsy. If the labor happens before 34 weeks of pregnancy, you may need to stay in the hospital. What should I do if I think I am in preterm labor? If you think that you are going into preterm labor, call your health care provider right away. How can I prevent preterm labor in future pregnancies? To increase your chance of having a full-term pregnancy:  Do not use any tobacco products, such as cigarettes, chewing tobacco, and e-cigarettes. If you need help quitting, ask your health care provider.  Do not use street drugs or medicines that have not been prescribed to you during your pregnancy.  Talk with your health care provider before taking any herbal supplements, even if you have been taking them regularly.  Make sure you gain a healthy amount of weight during your pregnancy.  Watch for infection. If you think that you might have an infection, get it checked right away.  Make sure to tell your health care provider if you have gone into preterm labor before. This information is not intended to replace advice given to you by your  health care provider. Make sure you discuss any questions you have with your health care provider. Document Released: 01/30/2004 Document Revised: 04/21/2016 Document Reviewed: 04/01/2016 Elsevier Interactive Patient Education  2017 [employer].

## 2016-10-30 ENCOUNTER — Ambulatory Visit (HOSPITAL_COMMUNITY)
Admission: RE | Admit: 2016-10-30 | Discharge: 2016-10-30 | Disposition: A | Discharge status: Home / Self Care | Payer: Medicaid Other | Source: Ambulatory Visit | Attending: Obstetrics and Gynecology | Admitting: Obstetrics and Gynecology

## 2016-10-30 ENCOUNTER — Encounter (HOSPITAL_COMMUNITY): Payer: Self-pay

## 2016-10-30 ENCOUNTER — Other Ambulatory Visit (HOSPITAL_COMMUNITY): Payer: Self-pay | Admitting: Maternal and Fetal Medicine

## 2016-10-30 ENCOUNTER — Other Ambulatory Visit (HOSPITAL_COMMUNITY): Payer: Self-pay | Admitting: *Deleted

## 2016-10-30 DIAGNOSIS — O30043 Twin pregnancy, dichorionic/diamniotic, third trimester: Secondary | ICD-10-CM

## 2016-10-30 DIAGNOSIS — Z3689 Encounter for other specified antenatal screening: Secondary | ICD-10-CM | POA: Insufficient documentation

## 2016-10-30 DIAGNOSIS — Z3A31 31 weeks gestation of pregnancy: Secondary | ICD-10-CM

## 2016-10-30 DIAGNOSIS — O365931 Maternal care for other known or suspected poor fetal growth, third trimester, fetus 1: Secondary | ICD-10-CM | POA: Insufficient documentation

## 2016-11-11 ENCOUNTER — Ambulatory Visit (INDEPENDENT_AMBULATORY_CARE_PROVIDER_SITE_OTHER): Payer: Medicaid Other | Admitting: Obstetrics and Gynecology

## 2016-11-11 VITALS — BP 117/72 | HR 89 | Wt 162.3 lb

## 2016-11-11 DIAGNOSIS — O0992 Supervision of high risk pregnancy, unspecified, second trimester: Secondary | ICD-10-CM

## 2016-11-11 DIAGNOSIS — O30043 Twin pregnancy, dichorionic/diamniotic, third trimester: Secondary | ICD-10-CM

## 2016-11-11 DIAGNOSIS — Z8619 Personal history of other infectious and parasitic diseases: Secondary | ICD-10-CM

## 2016-11-11 LAB — POCT URINALYSIS DIP (DEVICE)
Bilirubin Urine: NEGATIVE
GLUCOSE, UA: NEGATIVE mg/dL
Hgb urine dipstick: NEGATIVE
Ketones, ur: NEGATIVE mg/dL
Leukocytes, UA: NEGATIVE
Nitrite: NEGATIVE
PH: 5.5 (ref 5.0–8.0)
PROTEIN: NEGATIVE mg/dL
Specific Gravity, Urine: <=1.005 (ref 1.005–1.030)
UROBILINOGEN UA: 0.2 mg/dL (ref 0.0–1.0)

## 2016-11-11 NOTE — Progress Notes (Signed)
Subjective:  Suzanne Hernandez is a 30 y.o. G1P0 at [redacted]w[redacted]d being seen today for ongoing prenatal care.  She is currently monitored for the following issues for this high-risk pregnancy and has Twin pregnancy, twins dichorionic and diamniotic; Subserous leiomyoma of uterus; History of herpes simplex type 2 infection; and Supervision of high risk pregnancy in second trimester on her problem list.  Patient reports no complaints.  Contractions: Irritability. Vag. Bleeding: None.  Movement: Present. Denies leaking of fluid.   The following portions of the patient's history were reviewed and updated as appropriate: allergies, current medications, past family history, past medical history, past social history, past surgical history and problem list. Problem list updated.  Objective:   Vitals:   11/11/16 1300  BP: 117/72  Pulse: 89  Weight: 162 lb 4.8 oz (73.6 kg)    Fetal Status: Fetal Heart Rate (bpm): ?/128   Movement: Present     General:  Alert, oriented and cooperative. Patient is in no acute distress.  Skin: Skin is warm and dry. No rash noted.   Cardiovascular: Normal heart rate noted  Respiratory: Normal respiratory effort, no problems with respiration noted  Abdomen: Soft, gravid, appropriate for gestational age. Pain/Pressure: Present     Pelvic:  Cervical exam deferred        Extremities: Normal range of motion.  Edema: None  Mental Status: Normal mood and affect. Normal behavior. Normal judgment and thought content.   Urinalysis:      Assessment and Plan:  Pregnancy: G1P0 at [redacted]w[redacted]d  1. History of herpes simplex type 2 infection Continue with Valtrex supression  2. Dichorionic diamniotic twin pregnancy in third trimester Growth U/S on 11/28/15  3. Supervision of high risk pregnancy in second trimester Declines Tdap  Preterm labor symptoms and general obstetric precautions including but not limited to vaginal bleeding, contractions, leaking of fluid and fetal movement were  reviewed in detail with the patient. Please refer to After Visit Summary for other counseling recommendations.  Return in about 2 weeks (around 11/25/2016).   Chancy Milroy, MD

## 2016-11-23 NOTE — L&D Delivery Note (Signed)
  Oakli, Zech Valders [id number]  Delivery Note At 3:54 AM a viable female was delivered via Vaginal, Spontaneous Delivery (Presentation: LOA ; Vertex  ).  APGAR: 8 , 9 ; weight pending .   Placenta status:delivered intact with gentle traction.  Cord: 3 vessels with the following complications: none.  Cord pH: not collected  Mom to postpartum.  Baby to Couplet care / Skin to Skin.      Hadlyn, Mumper [place] [id number]  Delivery Note At 3:58 AM a viable female was delivered via Vaginal, Spontaneous Delivery (Presentation: footling breech).  APGAR: 5 , 9 ; weight  pending.   Placenta status: delivered intact with gentle traction.  Cord: 3 vessels with the following complications: none .  Cord pH: pending  Anesthesia:  epidural Episiotomy:  none Lacerations:  labial Est. Blood Loss (mL):  150  Mom to postpartum.  Baby to Couplet care / Skin to Skin.  Jacquiline Doe 12/15/2016, 4:26 AM

## 2016-11-25 ENCOUNTER — Other Ambulatory Visit (HOSPITAL_COMMUNITY)
Admission: RE | Admit: 2016-11-25 | Discharge: 2016-11-25 | Disposition: A | Discharge status: Home / Self Care | Payer: Medicaid Other | Source: Ambulatory Visit | Attending: Obstetrics and Gynecology | Admitting: Obstetrics and Gynecology

## 2016-11-25 ENCOUNTER — Ambulatory Visit (INDEPENDENT_AMBULATORY_CARE_PROVIDER_SITE_OTHER): Payer: Self-pay | Admitting: Obstetrics and Gynecology

## 2016-11-25 VITALS — BP 129/77 | HR 95 | Wt 169.5 lb

## 2016-11-25 DIAGNOSIS — O30043 Twin pregnancy, dichorionic/diamniotic, third trimester: Secondary | ICD-10-CM

## 2016-11-25 DIAGNOSIS — Z113 Encounter for screening for infections with a predominantly sexual mode of transmission: Secondary | ICD-10-CM | POA: Diagnosis present

## 2016-11-25 DIAGNOSIS — O0992 Supervision of high risk pregnancy, unspecified, second trimester: Secondary | ICD-10-CM

## 2016-11-25 LAB — OB RESULTS CONSOLE GBS: STREP GROUP B AG: POSITIVE

## 2016-11-25 NOTE — Addendum Note (Signed)
Addended by: Langston Reusing on: 11/25/2016 03:26 PM   Modules accepted: Orders

## 2016-11-25 NOTE — Progress Notes (Signed)
Subjective:  Suzanne Hernandez is a 31 y.o. G1P0 at [redacted]w[redacted]d being seen today for ongoing prenatal care.  She is currently monitored for the following issues for this high-risk pregnancy and has Twin pregnancy, twins dichorionic and diamniotic; Subserous leiomyoma of uterus; History of herpes simplex type 2 infection; and Supervision of high risk pregnancy in second trimester on her problem list.  Patient reports no complaints.  Contractions: Irregular. Vag. Bleeding: None.  Movement: Present. Denies leaking of fluid.   The following portions of the patient's history were reviewed and updated as appropriate: allergies, current medications, past family history, past medical history, past social history, past surgical history and problem list. Problem list updated.  Objective:   Vitals:   11/25/16 1442  BP: 129/77  Pulse: 95  Weight: 169 lb 8 oz (76.9 kg)    Fetal Status: Fetal Heart Rate (bpm): NST   Movement: Present     General:  Alert, oriented and cooperative. Patient is in no acute distress.  Skin: Skin is warm and dry. No rash noted.   Cardiovascular: Normal heart rate noted  Respiratory: Normal respiratory effort, no problems with respiration noted  Abdomen: Soft, gravid, appropriate for gestational age. Pain/Pressure: Present     Pelvic:  Cervical exam performed        Extremities: Normal range of motion.  Edema: Trace  Mental Status: Normal mood and affect. Normal behavior. Normal judgment and thought content.   Urinalysis:      Assessment and Plan:  Pregnancy: G1P0 at [redacted]w[redacted]d  1. Dichorionic diamniotic twin pregnancy in third trimester Growth scan this Friday - Culture, beta strep (group b only) - GC/Chlamydia probe amp ([place] )not at [facility] - Fetal nonstress test IOL at 38 weeks  2. Supervision of high risk pregnancy in second trimester  - Culture, beta strep (group b only) - GC/Chlamydia probe amp ()not at [facility]  Preterm labor symptoms and general obstetric  precautions including but not limited to vaginal bleeding, contractions, leaking of fluid and fetal movement were reviewed in detail with the patient. Please refer to After Visit Summary for other counseling recommendations.  Return in about 1 week (around 12/02/2016) for OB visit.   Chancy Milroy, MD

## 2016-11-26 LAB — GC/CHLAMYDIA PROBE AMP (~~LOC~~) NOT AT ARMC
Chlamydia: NEGATIVE
Neisseria Gonorrhea: NEGATIVE

## 2016-11-27 ENCOUNTER — Encounter (HOSPITAL_COMMUNITY): Payer: Self-pay

## 2016-11-27 ENCOUNTER — Other Ambulatory Visit (HOSPITAL_COMMUNITY): Payer: Self-pay | Admitting: Obstetrics and Gynecology

## 2016-11-27 ENCOUNTER — Ambulatory Visit (HOSPITAL_COMMUNITY)
Admission: RE | Admit: 2016-11-27 | Discharge: 2016-11-27 | Disposition: A | Discharge status: Home / Self Care | Payer: Medicaid Other | Source: Ambulatory Visit | Attending: Obstetrics and Gynecology | Admitting: Obstetrics and Gynecology

## 2016-11-27 DIAGNOSIS — O30043 Twin pregnancy, dichorionic/diamniotic, third trimester: Secondary | ICD-10-CM | POA: Diagnosis present

## 2016-11-27 DIAGNOSIS — O365931 Maternal care for other known or suspected poor fetal growth, third trimester, fetus 1: Secondary | ICD-10-CM

## 2016-11-27 DIAGNOSIS — Z3A35 35 weeks gestation of pregnancy: Secondary | ICD-10-CM | POA: Insufficient documentation

## 2016-11-27 LAB — CULTURE, BETA STREP (GROUP B ONLY)

## 2016-11-30 ENCOUNTER — Ambulatory Visit (HOSPITAL_COMMUNITY)
Admission: RE | Admit: 2016-11-30 | Discharge: 2016-11-30 | Disposition: A | Discharge status: Home / Self Care | Payer: Medicaid Other | Source: Ambulatory Visit | Attending: Obstetrics and Gynecology | Admitting: Obstetrics and Gynecology

## 2016-11-30 ENCOUNTER — Encounter (HOSPITAL_COMMUNITY): Payer: Self-pay

## 2016-11-30 DIAGNOSIS — Z3A36 36 weeks gestation of pregnancy: Secondary | ICD-10-CM | POA: Insufficient documentation

## 2016-11-30 DIAGNOSIS — O30043 Twin pregnancy, dichorionic/diamniotic, third trimester: Secondary | ICD-10-CM | POA: Diagnosis not present

## 2016-12-03 ENCOUNTER — Ambulatory Visit: Payer: Self-pay

## 2016-12-03 ENCOUNTER — Encounter: Payer: Medicaid Other | Admitting: Obstetrics and Gynecology

## 2016-12-03 ENCOUNTER — Ambulatory Visit (INDEPENDENT_AMBULATORY_CARE_PROVIDER_SITE_OTHER): Payer: Medicaid Other | Admitting: Advanced Practice Midwife

## 2016-12-03 VITALS — BP 126/87 | HR 77 | Wt 170.1 lb

## 2016-12-03 DIAGNOSIS — O30043 Twin pregnancy, dichorionic/diamniotic, third trimester: Secondary | ICD-10-CM | POA: Diagnosis not present

## 2016-12-03 NOTE — Progress Notes (Signed)
Pt informed that the ultrasound is considered a limited OB ultrasound and is not intended to be a complete ultrasound exam.  Patient also informed that the ultrasound is not being completed with the intent of assessing for fetal or placental anomalies or any pelvic abnormalities.  Explained that the purpose of today's ultrasound is to assess for presentation and amniotic fluid volume.  Patient acknowledges the purpose of the exam and the limitations of the study.    Pt reports increased amount of UC's today. [place] for growth and UA doppler done 1/5.

## 2016-12-03 NOTE — Progress Notes (Signed)
   PRENATAL VISIT NOTE  Subjective:  Suzanne Hernandez is a 31 y.o. G1P0 at [redacted]w[redacted]d being seen today for ongoing prenatal care.  She is currently monitored for the following issues for this high-risk pregnancy and has Twin pregnancy, twins dichorionic and diamniotic; Subserous leiomyoma of uterus; History of herpes simplex type 2 infection; and Supervision of high risk pregnancy in second trimester on her problem list.  Patient reports occasional contractions.  Contractions: Regular. Vag. Bleeding: None.  Movement: Present. Denies leaking of fluid.   The following portions of the patient's history were reviewed and updated as appropriate: allergies, current medications, past family history, past medical history, past social history, past surgical history and problem list. Problem list updated.  Objective:   Vitals:   12/03/16 1454  BP: 126/87  Pulse: 77  Weight: 170 lb 1.6 oz (77.2 kg)    Fetal Status: Fetal Heart Rate (bpm): NST   Movement: Present  Presentation: Vertex  General:  Alert, oriented and cooperative. Patient is in no acute distress.  Skin: Skin is warm and dry. No rash noted.   Cardiovascular: Normal heart rate noted  Respiratory: Normal respiratory effort, no problems with respiration noted  Abdomen: Soft, gravid, appropriate for gestational age. Pain/Pressure: Present     Pelvic:  Cervical exam performed Dilation: Closed Effacement (%): 50 Station: Ballotable  Extremities: Normal range of motion.  Edema: Trace  Mental Status: Normal mood and affect. Normal behavior. Normal judgment and thought content.   Assessment and Plan:  Pregnancy: G1P0 at [redacted]w[redacted]d  1. Dichorionic diamniotic twin pregnancy in third trimester --Recent US with normal dopplers, concordant growth, recommend delivery by 38 weeks. Pt does not desire IOL if possible. Discussed with pt, cervical exam today.  Will continue to discuss at next prenatal visit if no active labor. - Fetal nonstress test - US OB  Limited  Term labor symptoms and general obstetric precautions including but not limited to vaginal bleeding, contractions, leaking of fluid and fetal movement were reviewed in detail with the patient. Please refer to After Visit Summary for other counseling recommendations.  Return in about 7 days (around 12/10/2016) for Ob fu and NST/AFI - twins - pt prefers 0940.   Elvera Maria, CNM

## 2016-12-07 ENCOUNTER — Ambulatory Visit (INDEPENDENT_AMBULATORY_CARE_PROVIDER_SITE_OTHER): Payer: Medicaid Other | Admitting: *Deleted

## 2016-12-07 VITALS — BP 126/75

## 2016-12-07 DIAGNOSIS — O30043 Twin pregnancy, dichorionic/diamniotic, third trimester: Secondary | ICD-10-CM

## 2016-12-07 NOTE — Progress Notes (Signed)
NSTx2 reviewed and reactive.  Corydon Schweiss L. Harraway-Smith, M.D., Cherlynn June

## 2016-12-07 NOTE — Progress Notes (Signed)
Sx of labor reviewed.  Pt is aware of some UC's during NST but states they are not painful.

## 2016-12-09 ENCOUNTER — Encounter: Payer: Medicaid Other | Admitting: Obstetrics & Gynecology

## 2016-12-10 ENCOUNTER — Telehealth: Payer: Self-pay | Admitting: *Deleted

## 2016-12-10 ENCOUNTER — Ambulatory Visit (HOSPITAL_COMMUNITY): Payer: Medicaid Other

## 2016-12-10 ENCOUNTER — Other Ambulatory Visit: Payer: Medicaid Other | Admitting: Obstetrics and Gynecology

## 2016-12-10 NOTE — Telephone Encounter (Signed)
Called pt and left message stating that I am calling to check on her since our office was closed this morning and she did not have her scheduled appt. If she has any concerns and would like to be seen in the office tomorrow ,morning, she may call back to the appt line or leave a message on the nurse voice mail. Otherwise she should keep her appt as scheduled on 1/22 @ 1030. If she experiences decreased FM she should go to MAU for evaluation. This is extremely important.

## 2016-12-13 ENCOUNTER — Inpatient Hospital Stay (HOSPITAL_COMMUNITY): Payer: Medicaid Other

## 2016-12-13 ENCOUNTER — Encounter (HOSPITAL_COMMUNITY): Payer: Self-pay | Admitting: *Deleted

## 2016-12-13 ENCOUNTER — Inpatient Hospital Stay (HOSPITAL_COMMUNITY)
Admission: AD | Admit: 2016-12-13 | Discharge: 2016-12-17 | DRG: 774 | Disposition: A | Discharge status: Home / Self Care | Payer: Medicaid Other | Source: Ambulatory Visit | Attending: Family Medicine | Admitting: Family Medicine

## 2016-12-13 DIAGNOSIS — O328XX2 Maternal care for other malpresentation of fetus, fetus 2: Secondary | ICD-10-CM | POA: Diagnosis present

## 2016-12-13 DIAGNOSIS — Z8249 Family history of ischemic heart disease and other diseases of the circulatory system: Secondary | ICD-10-CM

## 2016-12-13 DIAGNOSIS — O1494 Unspecified pre-eclampsia, complicating childbirth: Secondary | ICD-10-CM

## 2016-12-13 DIAGNOSIS — O99214 Obesity complicating childbirth: Secondary | ICD-10-CM | POA: Diagnosis present

## 2016-12-13 DIAGNOSIS — Z3A38 38 weeks gestation of pregnancy: Secondary | ICD-10-CM

## 2016-12-13 DIAGNOSIS — D252 Subserosal leiomyoma of uterus: Secondary | ICD-10-CM | POA: Diagnosis present

## 2016-12-13 DIAGNOSIS — O3413 Maternal care for benign tumor of corpus uteri, third trimester: Secondary | ICD-10-CM | POA: Diagnosis present

## 2016-12-13 DIAGNOSIS — O1414 Severe pre-eclampsia complicating childbirth: Principal | ICD-10-CM | POA: Diagnosis present

## 2016-12-13 DIAGNOSIS — O30049 Twin pregnancy, dichorionic/diamniotic, unspecified trimester: Secondary | ICD-10-CM | POA: Diagnosis present

## 2016-12-13 DIAGNOSIS — O30009 Twin pregnancy, unspecified number of placenta and unspecified number of amniotic sacs, unspecified trimester: Secondary | ICD-10-CM

## 2016-12-13 DIAGNOSIS — Z8619 Personal history of other infectious and parasitic diseases: Secondary | ICD-10-CM | POA: Diagnosis present

## 2016-12-13 DIAGNOSIS — O30043 Twin pregnancy, dichorionic/diamniotic, third trimester: Secondary | ICD-10-CM | POA: Diagnosis present

## 2016-12-13 DIAGNOSIS — Z6832 Body mass index (BMI) 32.0-32.9, adult: Secondary | ICD-10-CM

## 2016-12-13 DIAGNOSIS — O0992 Supervision of high risk pregnancy, unspecified, second trimester: Secondary | ICD-10-CM

## 2016-12-13 DIAGNOSIS — O99824 Streptococcus B carrier state complicating childbirth: Secondary | ICD-10-CM | POA: Diagnosis present

## 2016-12-13 DIAGNOSIS — E669 Obesity, unspecified: Secondary | ICD-10-CM | POA: Diagnosis present

## 2016-12-13 DIAGNOSIS — O30003 Twin pregnancy, unspecified number of placenta and unspecified number of amniotic sacs, third trimester: Secondary | ICD-10-CM

## 2016-12-13 DIAGNOSIS — A6000 Herpesviral infection of urogenital system, unspecified: Secondary | ICD-10-CM | POA: Diagnosis present

## 2016-12-13 DIAGNOSIS — O9832 Other infections with a predominantly sexual mode of transmission complicating childbirth: Secondary | ICD-10-CM | POA: Diagnosis present

## 2016-12-13 LAB — PROTEIN / CREATININE RATIO, URINE
Creatinine, Urine: 51 mg/dL
Total Protein, Urine: <6 mg/dL

## 2016-12-13 LAB — COMPREHENSIVE METABOLIC PANEL
ALBUMIN: 3 g/dL — AB (ref 3.5–5.0)
ALK PHOS: 191 U/L — AB (ref 38–126)
ALT: 17 U/L (ref 14–54)
AST: 27 U/L (ref 15–41)
Anion gap: 6 (ref 5–15)
BILIRUBIN TOTAL: 1.1 mg/dL (ref 0.3–1.2)
BUN: 5 mg/dL — AB (ref 6–20)
CO2: 22 mmol/L (ref 22–32)
Calcium: 8.5 mg/dL — ABNORMAL LOW (ref 8.9–10.3)
Chloride: 110 mmol/L (ref 101–111)
Creatinine, Ser: 0.72 mg/dL (ref 0.44–1.00)
GFR calc Af Amer: >60 mL/min (ref >60)
GFR calc non Af Amer: >60 mL/min (ref >60)
GLUCOSE: 70 mg/dL (ref 65–99)
POTASSIUM: 3.8 mmol/L (ref 3.5–5.1)
Sodium: 138 mmol/L (ref 135–145)
TOTAL PROTEIN: 5.8 g/dL — AB (ref 6.5–8.1)

## 2016-12-13 LAB — CBC
HEMATOCRIT: 35.2 % — AB (ref 36.0–46.0)
Hemoglobin: 12 g/dL (ref 12.0–15.0)
MCH: 29.9 pg (ref 26.0–34.0)
MCHC: 34.1 g/dL (ref 30.0–36.0)
MCV: 87.6 fL (ref 78.0–100.0)
Platelets: 106 10*3/uL — ABNORMAL LOW (ref 150–400)
RBC: 4.02 MIL/uL (ref 3.87–5.11)
RDW: 17.2 % — AB (ref 11.5–15.5)
WBC: 6.1 10*3/uL (ref 4.0–10.5)

## 2016-12-13 MED ORDER — BUTALBITAL-APAP-CAFFEINE 50-325-40 MG PO TABS
1.0000 | ORAL_TABLET | Freq: Once | ORAL | Status: DC
  Filled 2016-12-13: qty 1

## 2016-12-13 NOTE — MAU Note (Signed)
Pt presents complaining of contractions every 10 minutes for 3 days. Denies leaking or bleeding. Reports good fetal movement of both babies. Twin pregnancy.

## 2016-12-14 ENCOUNTER — Inpatient Hospital Stay (HOSPITAL_COMMUNITY): Payer: Medicaid Other | Admitting: Anesthesiology

## 2016-12-14 ENCOUNTER — Other Ambulatory Visit: Payer: Medicaid Other

## 2016-12-14 DIAGNOSIS — O30009 Twin pregnancy, unspecified number of placenta and unspecified number of amniotic sacs, unspecified trimester: Secondary | ICD-10-CM

## 2016-12-14 DIAGNOSIS — D252 Subserosal leiomyoma of uterus: Secondary | ICD-10-CM | POA: Diagnosis present

## 2016-12-14 DIAGNOSIS — O99824 Streptococcus B carrier state complicating childbirth: Secondary | ICD-10-CM | POA: Diagnosis present

## 2016-12-14 DIAGNOSIS — E669 Obesity, unspecified: Secondary | ICD-10-CM | POA: Diagnosis present

## 2016-12-14 DIAGNOSIS — O3413 Maternal care for benign tumor of corpus uteri, third trimester: Secondary | ICD-10-CM | POA: Diagnosis present

## 2016-12-14 DIAGNOSIS — O328XX2 Maternal care for other malpresentation of fetus, fetus 2: Secondary | ICD-10-CM | POA: Diagnosis present

## 2016-12-14 DIAGNOSIS — Z3493 Encounter for supervision of normal pregnancy, unspecified, third trimester: Secondary | ICD-10-CM | POA: Diagnosis present

## 2016-12-14 DIAGNOSIS — Z8249 Family history of ischemic heart disease and other diseases of the circulatory system: Secondary | ICD-10-CM | POA: Diagnosis not present

## 2016-12-14 DIAGNOSIS — O30043 Twin pregnancy, dichorionic/diamniotic, third trimester: Secondary | ICD-10-CM | POA: Diagnosis present

## 2016-12-14 DIAGNOSIS — O1414 Severe pre-eclampsia complicating childbirth: Secondary | ICD-10-CM | POA: Diagnosis present

## 2016-12-14 DIAGNOSIS — A6000 Herpesviral infection of urogenital system, unspecified: Secondary | ICD-10-CM | POA: Diagnosis present

## 2016-12-14 DIAGNOSIS — Z3A38 38 weeks gestation of pregnancy: Secondary | ICD-10-CM | POA: Diagnosis not present

## 2016-12-14 DIAGNOSIS — O99214 Obesity complicating childbirth: Secondary | ICD-10-CM | POA: Diagnosis present

## 2016-12-14 DIAGNOSIS — Z6832 Body mass index (BMI) 32.0-32.9, adult: Secondary | ICD-10-CM | POA: Diagnosis not present

## 2016-12-14 DIAGNOSIS — O9832 Other infections with a predominantly sexual mode of transmission complicating childbirth: Secondary | ICD-10-CM | POA: Diagnosis present

## 2016-12-14 LAB — CBC
HCT: 40.3 % (ref 36.0–46.0)
Hemoglobin: 13.9 g/dL (ref 12.0–15.0)
MCH: 30.2 pg (ref 26.0–34.0)
MCHC: 34.5 g/dL (ref 30.0–36.0)
MCV: 87.4 fL (ref 78.0–100.0)
Platelets: 128 10*3/uL — ABNORMAL LOW (ref 150–400)
RBC: 4.61 MIL/uL (ref 3.87–5.11)
RDW: 17.4 % — ABNORMAL HIGH (ref 11.5–15.5)
WBC: 9.4 10*3/uL (ref 4.0–10.5)

## 2016-12-14 LAB — RPR: RPR Ser Ql: NONREACTIVE

## 2016-12-14 LAB — TYPE AND SCREEN
ABO/RH(D): A POS
Antibody Screen: NEGATIVE

## 2016-12-14 LAB — ABO/RH: ABO/RH(D): A POS

## 2016-12-14 MED ORDER — LACTATED RINGERS IV SOLN
INTRAVENOUS | Status: DC
  Administered 2016-12-14 (×3): via INTRAVENOUS

## 2016-12-14 MED ORDER — MAGNESIUM SULFATE BOLUS VIA INFUSION
4.0000 g | Freq: Once | INTRAVENOUS | Status: AC
  Administered 2016-12-14: 4 g via INTRAVENOUS
  Filled 2016-12-14: qty 500

## 2016-12-14 MED ORDER — LACTATED RINGERS IV SOLN
500.0000 mL | INTRAVENOUS | Status: DC | PRN
  Administered 2016-12-14: 250 mL via INTRAVENOUS

## 2016-12-14 MED ORDER — PENICILLIN G POTASSIUM 5000000 UNITS IJ SOLR
5.0000 10*6.[IU] | Freq: Once | INTRAMUSCULAR | Status: AC
  Administered 2016-12-14: 5 10*6.[IU] via INTRAVENOUS
  Filled 2016-12-14: qty 5

## 2016-12-14 MED ORDER — OXYCODONE-ACETAMINOPHEN 5-325 MG PO TABS: 2.0000 | ORAL_TABLET | ORAL | Status: DC | PRN

## 2016-12-14 MED ORDER — FENTANYL CITRATE (PF) 100 MCG/2ML IJ SOLN
50.0000 ug | INTRAMUSCULAR | Status: DC | PRN
  Filled 2016-12-14: qty 2

## 2016-12-14 MED ORDER — EPHEDRINE 5 MG/ML INJ
10.0000 mg | INTRAVENOUS | Status: DC | PRN
  Filled 2016-12-14: qty 4

## 2016-12-14 MED ORDER — LABETALOL HCL 5 MG/ML IV SOLN: 20.0000 mg | INTRAVENOUS | Status: DC | PRN

## 2016-12-14 MED ORDER — MAGNESIUM SULFATE 50 % IJ SOLN
2.0000 g/h | INTRAVENOUS | Status: DC
  Administered 2016-12-15: 2 g/h via INTRAVENOUS
  Filled 2016-12-14 (×2): qty 80

## 2016-12-14 MED ORDER — LACTATED RINGERS IV SOLN: 500.0000 mL | Freq: Once | INTRAVENOUS | Status: AC

## 2016-12-14 MED ORDER — HYDRALAZINE HCL 20 MG/ML IJ SOLN: 10.0000 mg | Freq: Once | INTRAMUSCULAR | Status: DC | PRN

## 2016-12-14 MED ORDER — OXYTOCIN 40 UNITS IN LACTATED RINGERS INFUSION - SIMPLE MED
1.0000 m[IU]/min | INTRAVENOUS | Status: DC
  Administered 2016-12-14: 2 m[IU]/min via INTRAVENOUS
  Administered 2016-12-15: 43.467 m[IU]/min via INTRAVENOUS

## 2016-12-14 MED ORDER — ACETAMINOPHEN 325 MG PO TABS: 650.0000 mg | ORAL_TABLET | ORAL | Status: DC | PRN

## 2016-12-14 MED ORDER — DIPHENHYDRAMINE HCL 50 MG/ML IJ SOLN
12.5000 mg | INTRAMUSCULAR | Status: DC | PRN
  Administered 2016-12-15: 12.5 mg via INTRAVENOUS
  Filled 2016-12-14: qty 1

## 2016-12-14 MED ORDER — FENTANYL 2.5 MCG/ML BUPIVACAINE 1/10 % EPIDURAL INFUSION (WH - ANES)
INTRAMUSCULAR | Status: AC
  Filled 2016-12-14: qty 100

## 2016-12-14 MED ORDER — PENICILLIN G POT IN DEXTROSE 60000 UNIT/ML IV SOLN
3.0000 10*6.[IU] | INTRAVENOUS | Status: DC
  Administered 2016-12-14 – 2016-12-15 (×6): 3 10*6.[IU] via INTRAVENOUS
  Filled 2016-12-14 (×9): qty 50

## 2016-12-14 MED ORDER — MISOPROSTOL 25 MCG QUARTER TABLET
25.0000 ug | ORAL_TABLET | ORAL | Status: DC
  Administered 2016-12-14: 25 ug via BUCCAL
  Filled 2016-12-14: qty 0.25
  Filled 2016-12-14 (×2): qty 1
  Filled 2016-12-14: qty 0.25
  Filled 2016-12-14: qty 1

## 2016-12-14 MED ORDER — PHENYLEPHRINE 40 MCG/ML (10ML) SYRINGE FOR IV PUSH (FOR BLOOD PRESSURE SUPPORT)
80.0000 ug | PREFILLED_SYRINGE | INTRAVENOUS | Status: DC | PRN
  Filled 2016-12-14: qty 5

## 2016-12-14 MED ORDER — SOD CITRATE-CITRIC ACID 500-334 MG/5ML PO SOLN: 30.0000 mL | ORAL | Status: DC | PRN

## 2016-12-14 MED ORDER — FENTANYL CITRATE (PF) 100 MCG/2ML IJ SOLN
100.0000 ug | INTRAMUSCULAR | Status: DC | PRN
  Administered 2016-12-14: 100 ug via INTRAVENOUS

## 2016-12-14 MED ORDER — LIDOCAINE HCL (PF) 1 % IJ SOLN
INTRAMUSCULAR | Status: DC | PRN
  Administered 2016-12-14: 3 mL via EPIDURAL
  Administered 2016-12-14: 2 mL via EPIDURAL
  Administered 2016-12-14: 5 mL via EPIDURAL

## 2016-12-14 MED ORDER — OXYTOCIN 40 UNITS IN LACTATED RINGERS INFUSION - SIMPLE MED
2.5000 [IU]/h | INTRAVENOUS | Status: DC
  Filled 2016-12-14 (×2): qty 1000

## 2016-12-14 MED ORDER — PHENYLEPHRINE 40 MCG/ML (10ML) SYRINGE FOR IV PUSH (FOR BLOOD PRESSURE SUPPORT)
PREFILLED_SYRINGE | INTRAVENOUS | Status: AC
  Filled 2016-12-14: qty 10

## 2016-12-14 MED ORDER — FLEET ENEMA 7-19 GM/118ML RE ENEM: 1.0000 | ENEMA | RECTAL | Status: DC | PRN

## 2016-12-14 MED ORDER — ONDANSETRON HCL 4 MG/2ML IJ SOLN
4.0000 mg | Freq: Four times a day (QID) | INTRAMUSCULAR | Status: DC | PRN
  Filled 2016-12-14: qty 2

## 2016-12-14 MED ORDER — OXYTOCIN BOLUS FROM INFUSION
500.0000 mL | Freq: Once | INTRAVENOUS | Status: AC
  Administered 2016-12-15: 500 mL/h via INTRAVENOUS

## 2016-12-14 MED ORDER — OXYCODONE-ACETAMINOPHEN 5-325 MG PO TABS: 1.0000 | ORAL_TABLET | ORAL | Status: DC | PRN

## 2016-12-14 MED ORDER — FENTANYL 2.5 MCG/ML BUPIVACAINE 1/10 % EPIDURAL INFUSION (WH - ANES)
14.0000 mL/h | INTRAMUSCULAR | Status: DC | PRN
  Administered 2016-12-14 – 2016-12-15 (×2): 14 mL/h via EPIDURAL
  Filled 2016-12-14: qty 100

## 2016-12-14 MED ORDER — LIDOCAINE HCL (PF) 1 % IJ SOLN
30.0000 mL | INTRAMUSCULAR | Status: DC | PRN
  Filled 2016-12-14: qty 30

## 2016-12-14 MED ORDER — TERBUTALINE SULFATE 1 MG/ML IJ SOLN: 0.2500 mg | Freq: Once | INTRAMUSCULAR | Status: DC | PRN

## 2016-12-14 NOTE — Progress Notes (Signed)
Procedure: Sterile spec exam done [facility] cath introduced thru cervix and in flated with 80cc sterile fluid. Pt and fetus's tolerated procedure well

## 2016-12-14 NOTE — Anesthesia Procedure Notes (Signed)
Epidural Patient location during procedure: OB Start time: 12/14/2016 7:34 PM End time: 12/14/2016 7:39 PM  Staffing Anesthesiologist: Catalina Gravel Performed: anesthesiologist   Preanesthetic Checklist Completed: patient identified, pre-op evaluation, timeout performed, IV checked, risks and benefits discussed and monitors and equipment checked  Epidural Patient position: sitting Prep: DuraPrep Patient monitoring: blood pressure and continuous pulse ox Approach: midline Location: L3-L4 Injection technique: LOR air  Needle:  Needle type: Tuohy  Needle gauge: 17 G Needle length: 9 cm Needle insertion depth: 4 cm Catheter size: 19 Gauge Catheter at skin depth: 9 cm Test dose: negative and Other (1% Lidocaine)  Additional Notes Patient identified.  Risk benefits discussed including failed block, incomplete pain control, headache, nerve damage, paralysis, blood pressure changes, nausea, vomiting, reactions to medication both toxic or allergic, and postpartum back pain.  Patient expressed understanding and wished to proceed.  All questions were answered.  Sterile technique used throughout procedure and epidural site dressed with sterile barrier dressing. No paresthesia or other complications noted. The patient did not experience any signs of intravascular injection such as tinnitus or metallic taste in mouth nor signs of intrathecal spread such as rapid motor block. Please see nursing notes for vital signs. Reason for block:procedure for pain

## 2016-12-14 NOTE — Anesthesia Pain Management Evaluation Note (Signed)
  CRNA Pain Management Visit Note  Patient: Suzanne Hernandez, 31 y.o., female  "Hello I am a member of the anesthesia team at [facility]. We have an anesthesia team available at all times to provide care throughout the hospital, including epidural management and anesthesia for C-section. I don't know your plan for the delivery whether it a natural birth, water birth, IV sedation, nitrous supplementation, doula or epidural, but we want to meet your pain goals."   1.Was your pain managed to your expectations on prior hospitalizations?   No prior hospitalizations  2.What is your expectation for pain management during this hospitalization?     Labor support without medications  3.How can we help you reach that goal?   Record the patient's initial score and the patient's pain goal.   Pain: 10  Pain Goal: 10 [facility] wants you to be able to say your pain was always managed very well.  Jabier Mutton 12/14/2016

## 2016-12-14 NOTE — Progress Notes (Signed)
Patient seen doing well. No complaints. IUPC and FSE placed. Patient's cervix is 9.5/100/-3. No other concerns. Continue to expect SVD. Plan to deliver in room.

## 2016-12-14 NOTE — Progress Notes (Signed)
OB Interim Progress Note  S: Evaluated patient for progress of labor. Pain is 10/10 and patient is not interested in pain medication at the moment  O: BP (!) 143/91 (BP Location: Left Arm)   Pulse 87   Temp 98 F (36.7 C) (Oral)   Resp 20   Ht 5\' 1"  (1.549 m)   Wt 77.1 kg (170 lb)   LMP 03/21/2016   BMI 32.12 kg/m    Dilation: 1 Effacement (%): 70 Cervical Position: Middle Station: -2 Presentation: Vertex Exam by:: [email or web address]  FHT: FHR: 125, mod variability, pos accels, no decels, contractions q2-11min   A/P: IOL with cytotec x1 next dose at 0700 and foley bulb placed at 0000. Patient holding on pain medication for now.  Continue expectant management Anticipate SVD  Eloise Levels, MD 12/14/2016, 6:42 AM PGY-1

## 2016-12-14 NOTE — H&P (Signed)
LABOR AND DELIVERY ADMISSION HISTORY AND PHYSICAL NOTE  Suzanne Hernandez is a 31 y.o. female G1P0 with IUP at [redacted]w[redacted]d by LMP presenting for IOL after presenting to MAU with contractions, elevated blood pressure, headaches and mild drop in platelets.  She initially presented to the MAU reporting contractions for the past 3-4 days that have been more regular this morning and pain worsening this evening. She does notice some swelling of her feet and ankles that have been persistent for the past couple of weeks.  She endorses mild headache, no changes in vision, nausea, vomiting or diarrhea.  She denies any leakage of fluid or vaginal bleeding.  Good fetal movement.  Her blood pressure was elevated to 149/94 upon presentation and had several readings around these values.  Per the patient she was recommended to have an induction at 38wks, but she declined. CBC showed platelets decreased from baseline of around 130 to 106 this evening and had normal AST/ALT and urine protein/creatinine ratio <0.3.    Prenatal History/Complications:  Past Medical History: Past Medical History:  Diagnosis Date  . Asthma   . Herpes simplex virus (HSV) infection   . Migraine   . Trichimoniasis     Past Surgical History: Past Surgical History:  Procedure Laterality Date  . NO PAST SURGERIES      Obstetrical History: OB History    Gravida Para Term Preterm AB Living   1             SAB TAB Ectopic Multiple Live Births                  Social History: Social History   Social History  . Marital status: Single    Spouse name: N/A  . Number of children: N/A  . Years of education: N/A   Social History Main Topics  . Smoking status: Never Smoker  . Smokeless tobacco: Never Used  . Alcohol use No  . Drug use: No  . Sexual activity: Yes   Other Topics Concern  . None   Social History Narrative  . None    Family History: Family History  Problem Relation Age of Onset  . Cancer Maternal Grandmother   .  Cancer Father   . Hypertension Mother     Allergies: No Known Allergies  Prescriptions Prior to Admission  Medication Sig Dispense Refill Last Dose  . Ferrous Gluconate (IRON 27 PO) Take by mouth.   Taking  . folic acid (FOLVITE) 1 MG tablet Take 1 tablet (1 mg total) by mouth daily. 60 tablet 3 Taking  . PRENATAL VIT-FE FUMARATE-FA PO Take 1 tablet by mouth daily.   Taking  . Probiotic Product (ACIDOPHILUS/GOAT MILK) CAPS Take by mouth.   Taking  . valACYclovir (VALTREX) 500 MG tablet Take 1 tablet (500 mg total) by mouth 2 (two) times daily. 60 tablet 6 Taking     Review of Systems   All systems reviewed and negative except as stated in HPI  Blood pressure 155/88, pulse 64, temperature 97.9 F (36.6 C), temperature source Oral, resp. rate 18, last menstrual period 03/21/2016. General appearance: alert and cooperative Lungs: NWOB, CTABL, no wheezing or rhonchi Heart: RRR, no MRG Abdomen: soft, non-tender; bowel sounds normal Extremities: No calf swelling or tenderness, 1+ pitting edema in feet and ankles bilaterally Presentation: cephalic vertex FHT:  Baseline HR 120s , moderate variability, accelerations present, no decelerations Contractions: q 6-7 mins Uterine activity: Dilation: 1 Effacement (%): 50 Station: -2 Exam by:: [email or web address]-[email or web address], rnc  Prenatal labs: ABO, Rh: A+ Antibody: Negative (07/06 0000) Rubella: immune RPR: NON REAC (11/22 1005)  HBsAg: Negative (07/06 0000)  HIV: NONREACTIVE (11/22 1005)  GBS:   Pos 1 hr Glucola: normal Genetic screening:  neg Anatomy US: normal  Prenatal Transfer Tool  Maternal Diabetes: No Genetic Screening: Normal Maternal Ultrasounds/Referrals: Normal Fetal Ultrasounds or other Referrals:  None Maternal Substance Abuse:  No Significant Maternal Medications:  None Significant Maternal Lab Results: Lab values include: Group B Strep positive  Results for orders placed or performed during the hospital encounter of 12/13/16  (from the past 24 hour(s))  Protein / creatinine ratio, urine   Collection Time: 12/13/16 10:38 PM  Result Value Ref Range   Creatinine, Urine 51.00 mg/dL   Total Protein, Urine <6 mg/dL   Protein Creatinine Ratio        0.00 - 0.15 mg/mg[Cre]  CBC   Collection Time: 12/13/16 10:40 PM  Result Value Ref Range   WBC 6.1 4.0 - 10.5 K/uL   RBC 4.02 3.87 - 5.11 MIL/uL   Hemoglobin 12.0 12.0 - 15.0 g/dL   HCT 35.2 (L) 36.0 - 46.0 %   MCV 87.6 78.0 - 100.0 fL   MCH 29.9 26.0 - 34.0 pg   MCHC 34.1 30.0 - 36.0 g/dL   RDW 17.2 (H) 11.5 - 15.5 %   Platelets 106 (L) 150 - 400 K/uL  Comprehensive metabolic panel   Collection Time: 12/13/16 10:40 PM  Result Value Ref Range   Sodium 138 135 - 145 mmol/L   Potassium 3.8 3.5 - 5.1 mmol/L   Chloride 110 101 - 111 mmol/L   CO2 22 22 - 32 mmol/L   Glucose, Bld 70 65 - 99 mg/dL   BUN 5 (L) 6 - 20 mg/dL   Creatinine, Ser 0.72 0.44 - 1.00 mg/dL   Calcium 8.5 (L) 8.9 - 10.3 mg/dL   Total Protein 5.8 (L) 6.5 - 8.1 g/dL   Albumin 3.0 (L) 3.5 - 5.0 g/dL   AST 27 15 - 41 U/L   ALT 17 14 - 54 U/L   Alkaline Phosphatase 191 (H) 38 - 126 U/L   Total Bilirubin 1.1 0.3 - 1.2 mg/dL   GFR calc non Af Amer >60 >60 mL/min   GFR calc Af Amer >60 >60 mL/min   Anion gap 6 5 - 15    Patient Active Problem List   Diagnosis Date Noted  . Supervision of high risk pregnancy in second trimester 07/23/2016  . Twin pregnancy, twins dichorionic and diamniotic 07/16/2016  . Subserous leiomyoma of uterus 07/16/2016  . History of herpes simplex type 2 infection 07/16/2016    Assessment: Suzanne Hernandez is a 31 y.o. G1P0 at [redacted]w[redacted]d with di/di twin pregnancy here for IOL after being found mildly hypertensive in MAU with mild headache and decreased platelets.  #Labor: IOL with buccal cytotec q3hrs. Anticipate SVD #Pain: None #FWB: Category I #ID:  GBS Pos #MOF: Breast #MOC: none  Eloise Levels, MD PGY-1 12/14/2016, 12:17 AM

## 2016-12-14 NOTE — Anesthesia Preprocedure Evaluation (Signed)
Anesthesia Evaluation  Patient identified by MRN, date of birth, ID band Patient awake  General Assessment Comment:Suzanne Hernandez is a 31 y.o. G1P0 at [redacted]w[redacted]d with di/di twin pregnancy here for IOL after being found mildly hypertensive in MAU with mild headache and decreased platelets  Reviewed: Allergy & Precautions, NPO status , Patient's Chart, lab work & pertinent test results  Airway Mallampati: II  TM Distance: >3 FB Neck ROM: Full    Dental  (+) Teeth Intact, Dental Advisory Given   Pulmonary asthma ,    Pulmonary exam normal breath sounds clear to auscultation       Cardiovascular negative cardio ROS Normal cardiovascular exam Rhythm:Regular Rate:Normal     Neuro/Psych  Headaches,    GI/Hepatic negative GI ROS, Neg liver ROS,   Endo/Other  Obesity   Renal/GU negative Renal ROS     Musculoskeletal negative musculoskeletal ROS (+)   Abdominal   Peds  Hematology  (+) Blood dyscrasia, , Plt 128k   Anesthesia Other Findings Day of surgery medications reviewed with the patient.  Reproductive/Obstetrics (+) Pregnancy Twins, pre-eclampsia                              Anesthesia Physical Anesthesia Plan  ASA: III  Anesthesia Plan: Epidural   Post-op Pain Management:    Induction:   Airway Management Planned:   Additional Equipment:   Intra-op Plan:   Post-operative Plan:   Informed Consent: I have reviewed the patients History and Physical, chart, labs and discussed the procedure including the risks, benefits and alternatives for the proposed anesthesia with the patient or authorized representative who has indicated his/her understanding and acceptance.   Dental advisory given  Plan Discussed with:   Anesthesia Plan Comments: (Patient identified. Risks/Benefits/Options discussed with patient including but not limited to bleeding, infection, nerve damage, paralysis, failed  block, incomplete pain control, headache, blood pressure changes, nausea, vomiting, reactions to medication both or allergic, itching and postpartum back pain. Confirmed with bedside nurse the patient's most recent platelet count. Confirmed with patient that they are not currently taking any anticoagulation, have any bleeding history or any family history of bleeding disorders. Patient expressed understanding and wished to proceed. All questions were answered. )        Anesthesia Quick Evaluation

## 2016-12-14 NOTE — Progress Notes (Signed)
Patient ID: Suzanne Hernandez, female   DOB: Sep 06, 1986, 31 y.o.   MRN: [id number]  S: Patient seen & examined for progress of labor. Patient comfortable, ambulating in the room.     O:  Vitals:   12/14/16 1600 12/14/16 1630 12/14/16 1700 12/14/16 1730  BP: 136/90 139/88 138/76 126/83  Pulse: 87 88 98 85  Resp: 20  20   Temp: 98.1 F (36.7 C)     TempSrc: Oral     Weight:      Height:        Dilation: 7 Effacement (%): 80 Cervical Position: Posterior Station: -3 Presentation: Vertex Exam by:: Dr. Vanetta Shawl  AROM performed on baby A, large amount of clear fluid returned, during contractions, baby A fetal head comes down to -1 station.   FHT: BABY A- 125 bpm, mod var, +accels, no decels; BABY B- 125 bpm, mod var, +accels, no decels  TOCO: q2-5min   A/P: AROM of baby A performed Continue pitocin Continue magnesium, BPs have been normal. Continue expectant management Anticipate SVD

## 2016-12-14 NOTE — Progress Notes (Signed)
Patient ID: Suzanne Hernandez, female   DOB: May 27, 1986, 31 y.o.   MRN: [id number]  S: Patient seen & examined for progress of labor. Patient comfortable in the room, breathing through contractions. Patient denies HA, changes in vision, RUQ/epigastric pain. Discussed with patient regarding diagnosis of preeclampsia (2/2 elev BPs-not severe range, and thrombocytopenia), and starting magnesium, patient understands.     O:  Vitals:   12/14/16 0236 12/14/16 0542 12/14/16 0812 12/14/16 0956  BP: 136/71 (!) 143/91 (!) 150/75 125/60  Pulse: 63 87 70 78  Resp: 18 20 18 18   Temp: 98 F (36.7 C) 98 F (36.7 C) 98.2 F (36.8 C)   TempSrc: Oral Oral Oral   Weight:      Height:        Dilation: 6 Effacement (%): 70 Cervical Position: Posterior Station: -3 Presentation: Vertex Exam by:: Dr. Vanetta Shawl  On SVE, FB was in the vagina.   FHT: BABY A - 125 bpm, mod var, +accels, no decels, BABY B- 125  TOCO: q2-73min   A/P: Starting IV Magnesium for Preeclampsia with severe features (low plt) Starting pitocin, FB is out Continue expectant management Anticipate SVD - plan to deliver in OR due to BABY B is transverse

## 2016-12-15 ENCOUNTER — Encounter (HOSPITAL_COMMUNITY): Payer: Self-pay

## 2016-12-15 ENCOUNTER — Encounter: Payer: Medicaid Other | Admitting: Family Medicine

## 2016-12-15 DIAGNOSIS — O30043 Twin pregnancy, dichorionic/diamniotic, third trimester: Secondary | ICD-10-CM

## 2016-12-15 DIAGNOSIS — Z3A38 38 weeks gestation of pregnancy: Secondary | ICD-10-CM

## 2016-12-15 DIAGNOSIS — O1414 Severe pre-eclampsia complicating childbirth: Secondary | ICD-10-CM

## 2016-12-15 DIAGNOSIS — O99824 Streptococcus B carrier state complicating childbirth: Secondary | ICD-10-CM

## 2016-12-15 LAB — CBC
HCT: 36.9 % (ref 36.0–46.0)
HEMATOCRIT: 37.6 % (ref 36.0–46.0)
HEMOGLOBIN: 13.1 g/dL (ref 12.0–15.0)
HEMOGLOBIN: 12.8 g/dL (ref 12.0–15.0)
MCH: 30 pg (ref 26.0–34.0)
MCH: 30 pg (ref 26.0–34.0)
MCHC: 34.8 g/dL (ref 30.0–36.0)
MCHC: 34.7 g/dL (ref 30.0–36.0)
MCV: 86 fL (ref 78.0–100.0)
MCV: 86.4 fL (ref 78.0–100.0)
Platelets: 133 10*3/uL — ABNORMAL LOW (ref 150–400)
Platelets: 118 10*3/uL — ABNORMAL LOW (ref 150–400)
RBC: 4.37 MIL/uL (ref 3.87–5.11)
RBC: 4.27 MIL/uL (ref 3.87–5.11)
RDW: 17.4 % — ABNORMAL HIGH (ref 11.5–15.5)
RDW: 17.2 % — ABNORMAL HIGH (ref 11.5–15.5)
WBC: 16.8 10*3/uL — AB (ref 4.0–10.5)
WBC: 14.1 10*3/uL — ABNORMAL HIGH (ref 4.0–10.5)

## 2016-12-15 MED ORDER — IBUPROFEN 600 MG PO TABS
600.0000 mg | ORAL_TABLET | Freq: Four times a day (QID) | ORAL | Status: DC
  Administered 2016-12-15 – 2016-12-16 (×5): 600 mg via ORAL
  Filled 2016-12-15 (×6): qty 1

## 2016-12-15 MED ORDER — ONDANSETRON HCL 4 MG/2ML IJ SOLN: 4.0000 mg | INTRAMUSCULAR | Status: DC | PRN

## 2016-12-15 MED ORDER — DIPHENHYDRAMINE HCL 25 MG PO CAPS: 25.0000 mg | ORAL_CAPSULE | Freq: Four times a day (QID) | ORAL | Status: DC | PRN

## 2016-12-15 MED ORDER — ZOLPIDEM TARTRATE 5 MG PO TABS: 5.0000 mg | ORAL_TABLET | Freq: Every evening | ORAL | Status: DC | PRN

## 2016-12-15 MED ORDER — ONDANSETRON HCL 4 MG PO TABS: 4.0000 mg | ORAL_TABLET | ORAL | Status: DC | PRN

## 2016-12-15 MED ORDER — CARBOPROST TROMETHAMINE 250 MCG/ML IM SOLN: 250.0000 ug | Freq: Once | INTRAMUSCULAR | Status: DC

## 2016-12-15 MED ORDER — MAGNESIUM SULFATE 50 % IJ SOLN
1.0000 g/h | INTRAVENOUS | Status: AC
  Administered 2016-12-15: 1 g/h via INTRAVENOUS
  Filled 2016-12-15: qty 80

## 2016-12-15 MED ORDER — ACETAMINOPHEN 325 MG PO TABS: 650.0000 mg | ORAL_TABLET | ORAL | Status: DC | PRN

## 2016-12-15 MED ORDER — DIBUCAINE 1 % RE OINT: 1.0000 "application " | TOPICAL_OINTMENT | RECTAL | Status: DC | PRN

## 2016-12-15 MED ORDER — MISOPROSTOL 200 MCG PO TABS
800.0000 ug | ORAL_TABLET | Freq: Once | ORAL | Status: AC
  Administered 2016-12-15: 800 ug via RECTAL

## 2016-12-15 MED ORDER — SENNOSIDES-DOCUSATE SODIUM 8.6-50 MG PO TABS: 2.0000 | ORAL_TABLET | ORAL | Status: DC

## 2016-12-15 MED ORDER — COCONUT OIL OIL
1.0000 "application " | TOPICAL_OIL | Status: DC | PRN
  Filled 2016-12-15: qty 120

## 2016-12-15 MED ORDER — WITCH HAZEL-GLYCERIN EX PADS: 1.0000 "application " | MEDICATED_PAD | CUTANEOUS | Status: DC | PRN

## 2016-12-15 MED ORDER — PRENATAL MULTIVITAMIN CH
1.0000 | ORAL_TABLET | Freq: Every day | ORAL | Status: DC
  Filled 2016-12-15: qty 1

## 2016-12-15 MED ORDER — TETANUS-DIPHTH-ACELL PERTUSSIS 5-2.5-18.5 LF-MCG/0.5 IM SUSP: 0.5000 mL | Freq: Once | INTRAMUSCULAR | Status: DC

## 2016-12-15 MED ORDER — MISOPROSTOL 200 MCG PO TABS
ORAL_TABLET | ORAL | Status: AC
  Filled 2016-12-15: qty 4

## 2016-12-15 MED ORDER — BENZOCAINE-MENTHOL 20-0.5 % EX AERO
1.0000 "application " | INHALATION_SPRAY | CUTANEOUS | Status: DC | PRN
  Filled 2016-12-15: qty 56

## 2016-12-15 MED ORDER — SIMETHICONE 80 MG PO CHEW: 80.0000 mg | CHEWABLE_TABLET | ORAL | Status: DC | PRN

## 2016-12-15 NOTE — Lactation Note (Signed)
This note was copied from a baby's chart. Lactation Consultation Note  Patient Name: Suzanne Hernandez [id number] Date: 12/15/2016 Reason for consult: Initial assessment;Infant < 6lbs;Multiple gestation Baby has low temp and skin to skin with mom.  I fingerfed baby 5 mls of colostrum and she tolerated well.  Instructed mom to pump at 1600 and I will assist with feeding then.  Maternal Data    Feeding Feeding Type: Breast Milk  LATCH Score/Interventions                      Lactation Tools Discussed/Used     Consult Status Consult Status: Follow-up Date: 12/16/16 Follow-up type: In-patient    Ave Filter 12/15/2016, 2:09 PM

## 2016-12-15 NOTE — Lactation Note (Signed)
This note was copied from a baby's chart. Lactation Consultation Note  Patient Name: Suzanne Hernandez [id number] Date: 12/15/2016 Reason for consult: Initial assessment;Infant < 6lbs;Multiple gestation  Breastfeeding consultation services and support information given to patient.  Late preterm policy given and reviewed due to low birth weights.  Discussed with mom that babies may be slow to breastfeed initially.  Stressed importance of pumping and supplementing with expressed milk or formula if needed every 3 hours.babies have been spoon fed once.  Mom just pumped 6 mls from each breast.  I fingerfed baby A using curved tip syringe.  Baby took 5 mls well.  I told mother we can start with fingerfeeding but if this becomes too difficult we will use a bottle.  I will follow up for next feeding.   Maternal Data Formula Feeding for Exclusion: No Has patient been taught Hand Expression?: Yes Does the patient have breastfeeding experience prior to this delivery?: No  Feeding Feeding Type: Breast Milk  LATCH Score/Interventions                      Lactation Tools Discussed/Used Pump Review: Setup, frequency, and cleaning;Milk Storage Initiated by:: RN Date initiated:: 12/15/16   Consult Status Consult Status: Follow-up Date: 12/16/16 Follow-up type: In-patient    Ave Filter 12/15/2016, 1:58 PM

## 2016-12-15 NOTE — Lactation Note (Signed)
This note was copied from a baby's chart. Lactation Consultation Note  Patient Name: Xanthe Stoots [id number] Date: 12/15/2016 Reason for consult: Follow-up assessment;Infant < 6lbs;Multiple gestation Assisted with positioning baby on right breast in football hold.  Baby very sleepy and showing little interest in feeding.  Allowed to nuzzle at the breast for 10 minutes prior to nursery nurse giving bottle.  Maternal Data    Feeding Feeding Type: Breast Fed  LATCH Score/Interventions Latch: Too sleepy or reluctant, no latch achieved, no sucking elicited. Intervention(s): Skin to skin;Teach feeding cues;Waking techniques  Audible Swallowing: None  Type of Nipple: Everted at rest and after stimulation  Comfort (Breast/Nipple): Soft / non-tender     Hold (Positioning): Assistance needed to correctly position infant at breast and maintain latch.  LATCH Score: 5  Lactation Tools Discussed/Used     Consult Status Consult Status: Follow-up Date: 12/16/16 Follow-up type: In-patient    Ave Filter 12/15/2016, 5:34 PM

## 2016-12-15 NOTE — Anesthesia Postprocedure Evaluation (Signed)
Anesthesia Post Note  Patient: Suzanne Hernandez  Procedure(s) Performed: * No procedures listed *  Patient location during evaluation: Mother Baby Anesthesia Type: Epidural Level of consciousness: awake and alert Pain management: pain level controlled Vital Signs Assessment: post-procedure vital signs reviewed and stable Respiratory status: spontaneous breathing, nonlabored ventilation and respiratory function stable Cardiovascular status: stable Postop Assessment: no headache, no backache and epidural receding Anesthetic complications: no        Last Vitals:  Vitals:   12/15/16 0600 12/15/16 0700  BP:  (!) 156/94  Pulse:  91  Resp: 18 18  Temp:      Last Pain:  Vitals:   12/15/16 0719  TempSrc:   PainSc: 0-No pain   Pain Goal: Patients Stated Pain Goal: 3 (12/15/16 0719)               Gilmer Mor

## 2016-12-15 NOTE — Lactation Note (Signed)
This note was copied from a baby's chart. Lactation Consultation Note  Patient Name: Nai Magazine [id number] Date: 12/15/2016 Reason for consult: Follow-up assessment;Multiple gestation;Infant < 6lbs Baby fingerfed 1 ml of colostrum and then assisted with positioning baby in football hold at breast.  Baby nuzzled at breast for 10 minutes with a few sucks off and on.  Explained to mom that formula supplementation is warranted due to low temps and small size.  Mom agreeable.  RN from nursery here to bottlefeed first feed.  Instructed to put baby at breast with cues, pump every 3 hours x 15 minutes and supplement with expressed milk/formula 10 mls per bottle.  Maternal Data    Feeding Feeding Type: Breast Fed Length of feed: 10 min  LATCH Score/Interventions Latch: Repeated attempts needed to sustain latch, nipple held in mouth throughout feeding, stimulation needed to elicit sucking reflex. Intervention(s): Adjust position;Assist with latch;Breast massage;Breast compression  Audible Swallowing: None  Type of Nipple: Everted at rest and after stimulation  Comfort (Breast/Nipple): Soft / non-tender     Hold (Positioning): Assistance needed to correctly position infant at breast and maintain latch. Intervention(s): Breastfeeding basics reviewed;Support Pillows;Position options;Skin to skin  LATCH Score: 6  Lactation Tools Discussed/Used     Consult Status Consult Status: Follow-up Date: 12/16/16 Follow-up type: In-patient    Ave Filter 12/15/2016, 5:18 PM

## 2016-12-16 NOTE — Lactation Note (Addendum)
This note was copied from a baby's chart. Lactation Consultation Note  Patient Name: Suzanne Hernandez [id number] Date: 12/16/2016 Reason for consult: Follow-up assessment;Infant < 6lbs;Multiple gestation   Baby B, now at 4% weight loss in just over 24 hours of age, now 2 hours old.Weigh 4 lbs 7 oz.  Mom is offering the breast, and not if she is not interested, mom stops and then offers bottle of Neosure. 22 cal, I advised parents to increased amount offered to the girls to 20 ml's today, 30 tomorrow. Parents' documentation is incomplete, and it appears they are not being fed at least every 3 hours. The importance of this was reviewed, as well as feeding more often with cues. Dad to bottle feed the babies, in side lying position, after mom breast feeds, and then mom to pump. Feeding times written on erase board. Parents understand that these times can change, when baby cue earlier. Parents very receptive to teaching, and very supportive of each other.  Fax to be sent to [facility], for mom to recieve a DEP. [facility] loaner program also reviewed with mom, if [facility] not able to get a DEP to mom on day of her discharge. Parents know to call for questions/conerns.   1015 - I went back to see how well the twins fed. Baby B had taken 7 mls. But I was able to get her to finish, and take a total of 20 mls'    Maternal Data    Feeding Feeding Type: Bottle Fed - Formula Nipple Type: Slow - flow Length of feed:  (baby laatched as I was leaving the room)  LATCH Score/Interventions Latch: Repeated attempts needed to sustain latch, nipple held in mouth throughout feeding, stimulation needed to elicit sucking reflex. Intervention(s): Teach feeding cues;Waking techniques Intervention(s): Adjust position;Assist with latch  Audible Swallowing: A few with stimulation Intervention(s): Hand expression  Type of Nipple: Everted at rest and after stimulation  Comfort (Breast/Nipple): Soft / non-tender     Hold  (Positioning): Assistance needed to correctly position infant at breast and maintain latch.  LATCH Score: 7  Lactation Tools Discussed/Used Tools: Flanges Flange Size:  (decreased mom to 21 flangs with a good fit)   Consult Status Consult Status: Follow-up Date: 12/17/16 Follow-up type: In-patient    Tonna Corner 12/16/2016, 9:50 AM

## 2016-12-16 NOTE — Lactation Note (Signed)
This note was copied from a baby's chart. Lactation Consultation Note  Patient Name: Suzanne Hernandez [id number] Date: 12/16/2016 Reason for consult: Follow-up assessment   With this mom and her twin girl babies, now 39 hours old. Baby A  Is now under 5 pounds, at 4 lbs 13.3 oz, and at 5 % weight loss just over 24 hours. I asked mom how often the baby were fed, and she said every 3, but documentation on the feeding diary was not complete, and is appears the babies were not fed often enough for their size. I reviewed with the parents the importance of the babies feeding at least every 3 hours,. or more with cues. Mom is limiting 15 minutes at the breast, or less if baby's tire, and then bottle feeding Neosure 22 cal. I advised the parents to increase formula amounts offered to 20 ml's, and 30 tomorrow, every 3 hours, after breast feeding. Side lying taught , and dad to feed babies after mom breast feeds,I told mom  breast feeding is not imperative every 3 hours,, if baby and or mom is too tired. Mom knows to pump at least every 3 hours, after feeding the babies,. and to feed EBM with next feeds. Dad very supportive and involved. The importance of accurate feeding diary documetnation discussed. Parents very receptive to teaching, and know  to call for questions/conerns. Mom active with [facility] in [facility], I will send a referral for mom to get a DEP at discharge. I also explained the [facility] loaner program to mom and dad.    Maternal Data    Feeding Feeding Type: Bottle Fed - Formula Nipple Type: Slow - flow Length of feed: 15 min (15 at breast, then formula fed by bottle)  LATCH Score/Interventions Latch: Repeated attempts needed to sustain latch, nipple held in mouth throughout feeding, stimulation needed to elicit sucking reflex. Intervention(s): Adjust position;Assist with latch  Audible Swallowing: A few with stimulation Intervention(s): Hand expression  Type of Nipple: Everted at rest and  after stimulation  Comfort (Breast/Nipple): Soft / non-tender     Hold (Positioning): Assistance needed to correctly position infant at breast and maintain latch.  LATCH Score: 7  Lactation Tools Discussed/Used Tools: Flanges Flange Size:  (decreased mom to 21 wirh god fit, to add cocnut oil with pumping, foolowed by hand expression) WIC Program: Yes (aactive in [place] - fax to be sent for DEP)   Consult Status Consult Status: Follow-up Date: 12/17/16 Follow-up type: In-patient    Tonna Corner 12/16/2016, 9:35 AM

## 2016-12-16 NOTE — Progress Notes (Signed)
Post Partum Day 1 Subjective: no complaints, up ad lib, voiding and tolerating PO  Objective: Blood pressure 110/64, pulse 74, temperature 97.9 F (36.6 C), temperature source Oral, resp. rate 16, height 5\' 1"  (1.549 m), weight 170 lb (77.1 kg), last menstrual period 03/21/2016, SpO2 98 %, unknown if currently breastfeeding. 1.8 L negative in last 24 hours Physical Exam:  General: alert, cooperative and appears stated age 31: appropriate Uterine Fundus: firm DVT Evaluation: No evidence of DVT seen on physical exam.   Recent Labs  12/15/16 0544 12/15/16 0757  HGB 13.1 12.8  HCT 37.6 36.9    Assessment/Plan: Plan for discharge tomorrow and Breastfeeding  Magnesium is off. BP stable. Continue to monitor.  LOS: 2 days   Suzanne Hernandez 12/16/2016, 7:43 AM

## 2016-12-17 ENCOUNTER — Other Ambulatory Visit: Payer: Medicaid Other | Admitting: Family Medicine

## 2016-12-17 MED ORDER — ACETAMINOPHEN 325 MG PO TABS: 650.0000 mg | ORAL_TABLET | ORAL | 0 refills | Status: DC | PRN

## 2016-12-17 NOTE — Discharge Summary (Signed)
OB Discharge Summary     Patient Name: Suzanne Hernandez DOB: 01-05-1986 MRN: [id number]  Date of admission: 12/13/2016 Delivering MD:    Teaira, Padberg [facility] [id number]  [facility], [address]   Versa, Marson Langston [id number]  [place], [place]   Date of discharge: 12/17/2016  Admitting diagnosis: 35 WKS, CTXS Intrauterine pregnancy: [redacted]w[redacted]d     Secondary diagnosis:  Active Problems:   Twin pregnancy, twins dichorionic and diamniotic   Subserous leiomyoma of uterus   History of herpes simplex type 2 infection   Supervision of high risk pregnancy in second trimester   Twin gestation with third pregnancy  Additional problems: pre-eclampsia with severe features     Discharge diagnosis: Term Pregnancy Delivered and Preeclampsia (severe)                                                                                                Post partum procedures:none  Augmentation: AROM, Pitocin, Cytotec and Foley Balloon  Complications: None  Hospital course:  Induction of Labor With Vaginal Delivery   31 y.o. yo G1P1002 at [redacted]w[redacted]d was admitted to the hospital 12/13/2016 for induction of labor.  Indication for induction: Preeclampsia.  Patient had an uncomplicated labor course as follows: Membrane Rupture Time/Date:    Coco, Menzie [facility] [id number]  5:57 PM   Yumi, Trompeter [facility] [id number]  3:57 AM ,   Revel, Napp [facility] [id number]  12/14/2016   Daniyla, Lutey [id number]  12/14/2016   Intrapartum Procedures: Episiotomy:    Porcia, Baldonado [facility] [id number]  None [1]   Brenlyn, [place] [facility] [id number]  None [1]                                         Lacerations:     Milton, Dorion [facility] [id number]  None [1]   Lue, Dunckel [facility] [id number]  None [1]  Patient had delivery of a Viable infants.  Information for the patient's newborn:  Kyriah, Huibregtse [id number]  Delivery Method: Vag-Spont Information for the patient's newborn:   Melenie, Torio [id number]  Delivery Method: Shaneil, Tapia [facility] [id number]  12/15/2016   Jilian, Lute [id number]  12/15/2016  Details of delivery can be found in separate delivery note.  Patient was continued on magnesium for 24 hours post partum. After discontinuing some BP [id number] systolic where noted, but pressures have been labile. SSome pressures as low as 118/80's. Will monitor BP closely but do not plan to start any medications.  Patient is discharged home 12/17/16.  Physical exam  Vitals:   12/16/16 2100 12/17/16 0100 12/17/16 0535 12/17/16 0800  BP: 118/62 126/70 139/78 (!) 147/85  Pulse: 80 81 71 67  Resp: 20 20 20  (!) 98  Temp:  98.3 F (36.8 C) 99.5 F (37.5 C) 97.5 F (36.4 C)  TempSrc:  Oral Oral Oral  SpO2: 99% 99% 99% 100%  Weight:      Height:       General: alert, cooperative and no distress  Lochia: appropriate Uterine Fundus: firm Incision: N/A DVT Evaluation: No evidence of DVT seen on physical exam. No cords or calf tenderness. Labs: Lab Results  Component Value Date   WBC 14.1 (H) 12/15/2016   HGB 12.8 12/15/2016   HCT 36.9 12/15/2016   MCV 86.4 12/15/2016   PLT 118 (L) 12/15/2016   CMP Latest Ref Rng & Units 12/13/2016  Glucose 65 - 99 mg/dL 70  BUN 6 - 20 mg/dL 5(L)  Creatinine 0.44 - 1.00 mg/dL 0.72  Sodium 135 - 145 mmol/L 138  Potassium 3.5 - 5.1 mmol/L 3.8  Chloride 101 - 111 mmol/L 110  CO2 22 - 32 mmol/L 22  Calcium 8.9 - 10.3 mg/dL 8.5(L)  Total Protein 6.5 - 8.1 g/dL 5.8(L)  Total Bilirubin 0.3 - 1.2 mg/dL 1.1  Alkaline Phos 38 - 126 U/L 191(H)  AST 15 - 41 U/L 27  ALT 14 - 54 U/L 17    Discharge instruction: per After Visit Summary and "Baby and Me Booklet".  After visit meds:  Allergies as of 12/17/2016   No Known Allergies     Medication List    STOP taking these medications   folic acid 1 MG tablet Commonly known as:  FOLVITE   valACYclovir 500 MG tablet Commonly known as:  VALTREX      TAKE these medications   acetaminophen 325 MG tablet Commonly known as:  TYLENOL Take 2 tablets (650 mg total) by mouth every 4 (four) hours as needed (for pain scale < 4).   Acidophilus/Goat Milk Caps Take 1 capsule by mouth daily.   IRON 27 PO Take 1 tablet by mouth daily.   PRENATAL VIT-FE FUMARATE-FA PO Take 1 tablet by mouth daily.       Diet: routine diet  Activity: Advance as tolerated. Pelvic rest for 6 weeks.   Outpatient follow up:1 wk BP check Follow up Appt:No future appointments. Follow up Visit:No Follow-up on file.  Postpartum contraception: None  Newborn Data:   Bridgett, Lecy [id number]  Live born female  Birth Weight: 5 lb 1.6 oz (2313 g) APGAR: 8, 9   Angellynn, Pimm [id number]  Live born female  Birth Weight: 4 lb 9.8 oz (2092 g) APGAR: 4, 8  Baby Feeding: Bottle Disposition:home with mother   12/17/2016 Jacquiline Doe, MD

## 2016-12-17 NOTE — Discharge Instructions (Signed)

## 2016-12-17 NOTE — Progress Notes (Signed)
Written discharge instructions given to patient. Pt verbalized an understanding. No concerns noted. Toya Smothers, RN

## 2016-12-19 NOTE — H&P (Signed)
LABOR AND DELIVERY ADMISSION HISTORY AND PHYSICAL NOTE  Suzanne Hernandez is a 31 y.o. female G1P0 with IUP at [redacted]w[redacted]d by LMP presenting for IOL after presenting to MAU with contractions, elevated blood pressure, headaches and mild drop in platelets.  She initially presented to the MAUreporting contractions for the past 3-4 days that have been more regular this morning and pain worsening this evening. She does notice some swelling of her feet and ankles that have been persistent for the past couple of weeks. She endorses mild headache, nochanges in vision, nausea, vomiting or diarrhea. She denies any leakage of fluid or vaginal bleeding. Good fetal movement. Her blood pressure was elevated to 149/94 upon presentation and had several readings around these values. Per the patient she was recommended to have an induction at 38wks, but she declined. CBC showed platelets decreased from baseline of around 130 to 106 this evening and had normal AST/ALT and urine protein/creatinine ratio <0.3.    Prenatal History/Complications:  Past Medical History:     Past Medical History:  Diagnosis Date  . Asthma   . Herpes simplex virus (HSV) infection   . Migraine   . Trichimoniasis     Past Surgical History:      Past Surgical History:  Procedure Laterality Date  . NO PAST SURGERIES      Obstetrical History:         OB History    Gravida Para Term Preterm AB Living   1             SAB TAB Ectopic Multiple Live Births                  Social History: Social History        Social History  . Marital status: Single    Spouse name: N/A  . Number of children: N/A  . Years of education: N/A       Social History Main Topics  . Smoking status: Never Smoker  . Smokeless tobacco: Never Used  . Alcohol use No  . Drug use: No  . Sexual activity: Yes   Other Topics Concern  . None      Social History Narrative  . None    Family History:      Family History   Problem Relation Age of Onset  . Cancer Maternal Grandmother   . Cancer Father   . Hypertension Mother     Allergies: No Known Allergies         Prescriptions Prior to Admission  Medication Sig Dispense Refill Last Dose  . Ferrous Gluconate (IRON 27 PO) Take by mouth.   Taking  . folic acid (FOLVITE) 1 MG tablet Take 1 tablet (1 mg total) by mouth daily. 60 tablet 3 Taking  . PRENATAL VIT-FE FUMARATE-FA PO Take 1 tablet by mouth daily.   Taking  . Probiotic Product (ACIDOPHILUS/GOAT MILK) CAPS Take by mouth.   Taking  . valACYclovir (VALTREX) 500 MG tablet Take 1 tablet (500 mg total) by mouth 2 (two) times daily. 60 tablet 6 Taking     Review of Systems   All systems reviewed and negative except as stated in HPI  Blood pressure 155/88, pulse 64, temperature 97.9 F (36.6 C), temperature source Oral, resp. rate 18, last menstrual period 03/21/2016. General appearance: alert and cooperative Lungs: NWOB, CTABL, no wheezing or rhonchi Heart: RRR, no MRG Abdomen: soft, non-tender; bowel sounds normal Extremities: No calf swelling or tenderness, 1+ pitting edema in feet and  ankles bilaterally Presentation: cephalic vertex FHT: Baseline HR 120s, moderate variability, accelerations present, no decelerations Contractions: q 6-31mins Uterine activity: Dilation: 1 Effacement (%): 50 Station: -2 Exam by:: [email or web address]-[email or web address], rnc    Prenatal labs: ABO, Rh: A+ Antibody: Negative (07/06 0000) Rubella: immune RPR: NON REAC (11/22 1005)  HBsAg: Negative (07/06 0000)  HIV: NONREACTIVE (11/22 1005)  GBS:   Pos 1 hr Glucola: normal Genetic screening:  neg Anatomy US: normal  Prenatal Transfer Tool  Maternal Diabetes: No Genetic Screening: Normal Maternal Ultrasounds/Referrals: Normal Fetal Ultrasounds or other Referrals:  None Maternal Substance Abuse:  No Significant Maternal Medications:  None Significant Maternal Lab Results: Lab values include: Group B  Strep positive       Results for orders placed or performed during the hospital encounter of 12/13/16 (from the past 24 hour(s))  Protein / creatinine ratio, urine   Collection Time: 12/13/16 10:38 PM  Result Value Ref Range   Creatinine, Urine 51.00 mg/dL   Total Protein, Urine <6 mg/dL   Protein Creatinine Ratio        0.00 - 0.15 mg/mg[Cre]  CBC   Collection Time: 12/13/16 10:40 PM  Result Value Ref Range   WBC 6.1 4.0 - 10.5 K/uL   RBC 4.02 3.87 - 5.11 MIL/uL   Hemoglobin 12.0 12.0 - 15.0 g/dL   HCT 35.2 (L) 36.0 - 46.0 %   MCV 87.6 78.0 - 100.0 fL   MCH 29.9 26.0 - 34.0 pg   MCHC 34.1 30.0 - 36.0 g/dL   RDW 17.2 (H) 11.5 - 15.5 %   Platelets 106 (L) 150 - 400 K/uL  Comprehensive metabolic panel   Collection Time: 12/13/16 10:40 PM  Result Value Ref Range   Sodium 138 135 - 145 mmol/L   Potassium 3.8 3.5 - 5.1 mmol/L   Chloride 110 101 - 111 mmol/L   CO2 22 22 - 32 mmol/L   Glucose, Bld 70 65 - 99 mg/dL   BUN 5 (L) 6 - 20 mg/dL   Creatinine, Ser 0.72 0.44 - 1.00 mg/dL   Calcium 8.5 (L) 8.9 - 10.3 mg/dL   Total Protein 5.8 (L) 6.5 - 8.1 g/dL   Albumin 3.0 (L) 3.5 - 5.0 g/dL   AST 27 15 - 41 U/L   ALT 17 14 - 54 U/L   Alkaline Phosphatase 191 (H) 38 - 126 U/L   Total Bilirubin 1.1 0.3 - 1.2 mg/dL   GFR calc non Af Amer >60 >60 mL/min   GFR calc Af Amer >60 >60 mL/min   Anion gap 6 5 - 15        Patient Active Problem List   Diagnosis Date Noted  . Supervision of high risk pregnancy in second trimester 07/23/2016  . Twin pregnancy, twins dichorionic and diamniotic 07/16/2016  . Subserous leiomyoma of uterus 07/16/2016  . History of herpes simplex type 2 infection 07/16/2016    Assessment: Suzanne Hernandez is a 31 y.o. G1P0 at [redacted]w[redacted]d with di/di twin pregnancy here for IOL after being found mildly hypertensive in MAU with mild headache and decreased platelets.  #Labor: IOL with buccal cytotec q3hrs. Anticipate SVD #Pain:   None #FWB: Category I #ID:      GBS Pos #MOF: Breast #MOC: none  Eloise Levels, MD PGY-1 12/14/2016, 12:17 AM

## 2016-12-23 ENCOUNTER — Encounter: Payer: Medicaid Other | Admitting: Obstetrics & Gynecology

## 2016-12-31 ENCOUNTER — Encounter: Payer: Medicaid Other | Admitting: Obstetrics and Gynecology

## 2017-01-21 NOTE — Addendum Note (Signed)
Addended by: Phillip Heal, DEMETRICE A on: 01/21/2017 05:16 PM   Modules accepted: Orders

## 2017-01-26 ENCOUNTER — Ambulatory Visit: Payer: Medicaid Other | Admitting: Obstetrics and Gynecology

## 2017-01-28 ENCOUNTER — Encounter: Payer: Self-pay | Admitting: Obstetrics and Gynecology

## 2017-01-28 ENCOUNTER — Ambulatory Visit (INDEPENDENT_AMBULATORY_CARE_PROVIDER_SITE_OTHER): Payer: Medicaid Other | Admitting: Obstetrics and Gynecology

## 2017-01-28 VITALS — BP 119/87 | HR 93 | Ht 60.0 in | Wt 134.5 lb

## 2017-01-28 DIAGNOSIS — IMO0002 Reserved for concepts with insufficient information to code with codable children: Secondary | ICD-10-CM

## 2017-01-28 DIAGNOSIS — K5901 Slow transit constipation: Secondary | ICD-10-CM

## 2017-01-28 DIAGNOSIS — O0992 Supervision of high risk pregnancy, unspecified, second trimester: Secondary | ICD-10-CM

## 2017-01-28 DIAGNOSIS — O9122 Nonpurulent mastitis associated with the puerperium: Secondary | ICD-10-CM

## 2017-01-28 MED ORDER — MICONAZOLE NITRATE 2 % EX CREA: 1.0000 "application " | TOPICAL_CREAM | Freq: Two times a day (BID) | CUTANEOUS | 0 refills | Status: DC

## 2017-01-28 MED ORDER — SENNOSIDES-DOCUSATE SODIUM 8.6-50 MG PO TABS: 1.0000 | ORAL_TABLET | Freq: Two times a day (BID) | ORAL | 1 refills | Status: DC

## 2017-01-28 MED ORDER — MICONAZOLE NITRATE 2 % EX CREA: 1.0000 "application " | TOPICAL_CREAM | Freq: Two times a day (BID) | CUTANEOUS | 0 refills | Status: AC

## 2017-01-28 MED ORDER — SENNOSIDES-DOCUSATE SODIUM 8.6-50 MG PO TABS: 1.0000 | ORAL_TABLET | Freq: Two times a day (BID) | ORAL | 1 refills | Status: AC

## 2017-01-28 NOTE — Addendum Note (Signed)
Addended by: Shelly Coss on: 01/28/2017 05:11 PM   Modules accepted: Orders

## 2017-01-28 NOTE — Patient Instructions (Signed)
Breastfeeding and Mastitis  Mastitis is inflammation of the breast tissue. It can occur in women who are breastfeeding. This can make breastfeeding painful. Mastitis will sometimes go away on its own, especially if it is not caused by an infection (non-infectious mastitis). Your health care provider will help determine if medical treatment is needed. Treatment may be needed if the condition is caused by a bacterial infection (infectious mastitis).  What are the causes?  This condition is often associated with a blocked milkduct, which can happen when too much milk builds up in the breast. Causes of excess milk in the breast can include:  · Poor latch-on. If your baby is not latched onto the breast properly, he or she may not empty your breast completely while breastfeeding.  · Allowing too much time to pass between feedings.  · Wearing a bra or other clothing that is too tight. This puts extra pressure on the milk ducts so milk does not flow through them as it should.  · Milk remaining in the breast because it is overfilled (engorged).  · Stress and fatigue.    Mastitis can also be caused by a bacterial infection. Bacteria may enter the breast tissue through cuts, cracks, or openings in the skin near the nipple area. Cracks in the skin are often caused when your baby does not latch on properly to the breast.  What are the signs or symptoms?  Symptoms of this condition include:  · Swelling, redness, tenderness, and pain in an area of the breast. This usually affects the upper part of the breast, toward the armpit region. In most cases, it affects only one breast. In some cases, it may occur on both breasts at the same time and affect a larger portion of breast tissue.  · Swelling of the glands under the arm on the same side.  · Fatigue, headache, and flu-like muscle aches.  · Fever.  · Rapid pulse.    Symptoms usually last 2 to 5 days. Breast pain and redness are at their worst on day 2 and day 3, and they usually go  away by day 5. If an infection is left to progress, a collection of pus (abscess) may develop.  How is this diagnosed?  This condition can be diagnosed based on your symptoms and a physical exam. You may also have tests, such as:  · Blood tests to determine if your body is fighting a bacterial infection.  · Mammogram or ultrasound tests to rule out other problems or diseases.  · Fluid tests. If an abscess has developed, the fluid in the abscess may be removed with a needle. The fluid may be analyzed to determine if bacteria are present.  · Breast milk may be cultured and tested for bacteria.    How is this treated?  This condition will sometimes go away on its own. Your health care provider may choose to wait 24 hours after first seeing you to decide whether treatment is needed. If treatment is needed, it may include:  · Strategies to manage breastfeeding. This includes continuing to breastfeed or pump in order to allow adequate milk flow, using breast massage, and applying heat or cold to the affected area.  · Self-care such as rest and increased fluid intake.  · Medicine for pain.  · Antibiotic medicine to treat a bacterial infection. This is usually taken by mouth.  · If an abscess has developed, it may be treated by removing fluid with a needle.      Follow these instructions at home:  Medicines  · Take over-the-counter and prescription medicines only as told by your health care provider.  · If you were prescribed an antibiotic medicine, take it as told by your health care provider. Do not stop taking the antibiotic even if you start to feel better.  General instructions  · Do not wear a tight or underwire bra. Wear a soft, supportive bra.  · Increase your fluid intake, especially if you have a fever.  · Get plenty of rest.  For breastfeeding:  · Continue to empty your breasts as often as possible, either by breastfeeding or using an electric breast pump. This will lower the pressure and the pain that comes with  it. Ask your health care provider if changes need to be made to your breastfeeding or pumping routine.  · Keep your nipples clean and dry.  · During breastfeeding, empty the first breast completely before going to the other breast. If your baby is not emptying your breasts completely, use a breast pump to empty your breasts.  · Use breast massage during feeding or pumping sessions.  · If directed, apply moist heat to the affected area of your breast right before breastfeeding or pumping. Use the heat source that your health care provider recommends.  · If directed, put ice on the affected area of your breast right after breastfeeding or pumping:  ? Put ice in a plastic bag.  ? Place a towel between your skin and the bag.  ? Leave the ice on for 20 minutes.  · If you go back to work, pump your breasts while at work to stay in time with your nursing schedule.  · Do not allow your breasts to become engorged.  Contact a health care provider if:  · You have pus-like discharge from the breast.  · You have a fever.  · Your symptoms do not improve within 2 days of starting treatment.  · Your symptoms return after you have recovered from a breast infection.  Get help right away if:  · Your pain and swelling are getting worse.  · You have pain that is not controlled with medicine.  · You have a red line extending from the breast toward your armpit.  Summary  · Mastitis is inflammation of the breast tissue. It is often caused by a blocked milk duct or bacteria.  · This condition may be treated with hot and cold compresses, medicines, self-care, and certain breastfeeding strategies.  · If you were prescribed an antibiotic medicine, take it as told by your health care provider. Do not stop taking the antibiotic even if you start to feel better.  · Continue to empty your breasts as often as possible either by breastfeeding or using an electric breast pump.  This information is not intended to replace advice given to you by your  health care provider. Make sure you discuss any questions you have with your health care provider.  Document Released: 03/06/2005 Document Revised: 11/10/2016 Document Reviewed: 11/10/2016  [employer] Interactive Patient Education © 2017 [employer]

## 2017-01-28 NOTE — Progress Notes (Signed)
Subjective:     Suzanne Hernandez is a 31 y.o. female who presents for a postpartum visit. She is 6 weeks postpartum following a spontaneous vaginal delivery. I have fully reviewed the prenatal and intrapartum course. The delivery was at 38.3 gestational weeks. Outcome: spontaneous vaginal delivery. Anesthesia: epidural. Postpartum course has been unremarkable. Babies course have been unremarkable. Babies are feeding by both breast and bottle - Enfamil Nutramigen. Bleeding staining only. Bowel function is normal. Bladder function is normal. Patient is not sexually active. Contraception method is none. Patient plans NFP. Depression/anxiety screening: negative.  Patient states she feels like she has a hernia since delivery. Patient reports nausea & occasional painful bowel movements. Also reports that one of the baby's has thrush. Reports some nipples discomfort/redness.  The following portions of the patient's history were reviewed and updated as appropriate: past family history and past social history.  Review of Systems Pertinent items are noted in HPI.   Objective:    There were no vitals taken for this visit.  General:  alert, cooperative and appears stated age   Breasts:  minimal erythema on nipple and aerola of right breast no TTP  Lungs: clear to auscultation bilaterally  Heart:  regular rate and rhythm, S1, S2 normal, no murmur, click, rub or gallop  Abdomen: soft, non-tender; bowel sounds normal; no masses,  no organomegaly, large stool felt in LLQ   Vulva:  not evaluated  Vagina: not evaluated  Cervix:  not evaluated  Corpus: not examined  Adnexa:  not evaluated  Rectal Exam: Not performed.        Assessment:     normal postpartum exam. Pap smear not done at today's visit.   Plan:    1. Contraception: rhythm method 2. Patient with breast pain concern for dermatitis with child with thrush miconazole given instructed to remove from breast prior to feeding. 4. Abdominal pain with  BM: likely secondary to constipation with palpable stool in LLQ. Senna-s given 3. Follow up in: 1 year or as needed.

## 2018-03-17 ENCOUNTER — Encounter: Payer: Self-pay | Admitting: *Deleted

## 2018-03-28 IMAGING — US US MFM OB DETAIL+14 WK
1 series · 12 of 28 positions shown · non-contrast
Comparison: none

[Series 1: us mfm ob detail+14 wk · 12 of 191 slices shown]
[im 8/191]
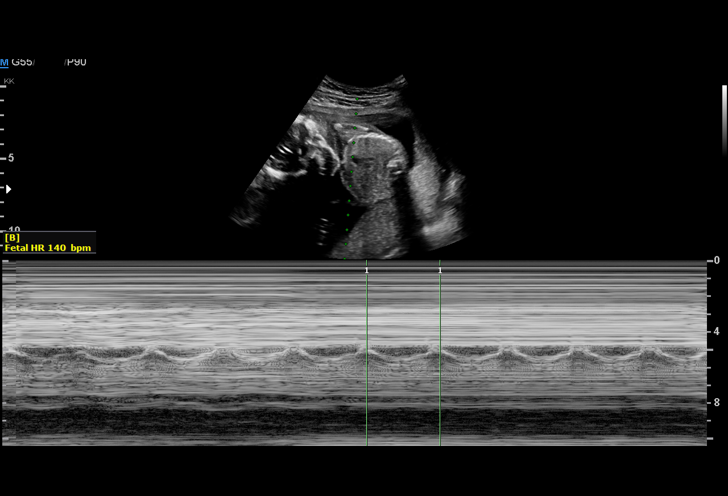
[im 22/191]
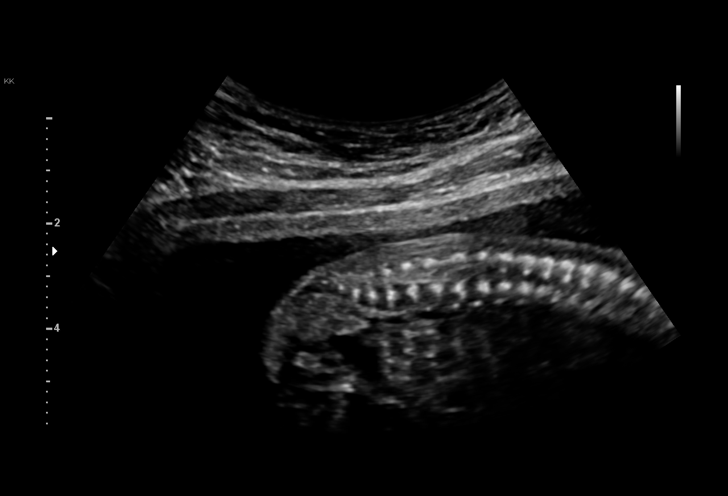
[im 36/191]
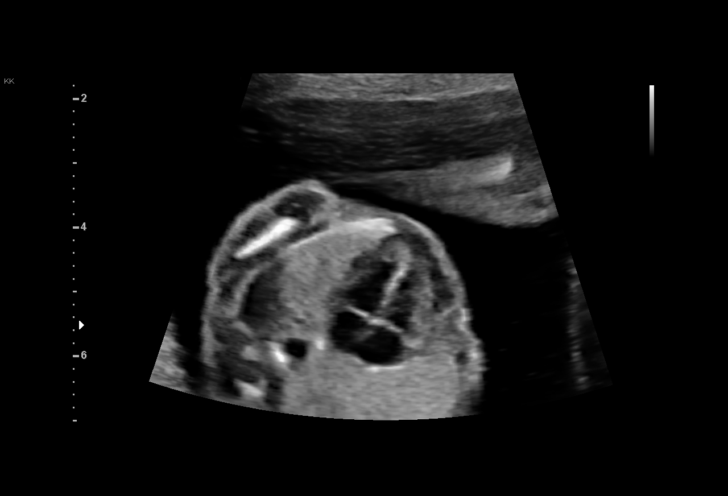
[im 57/191]
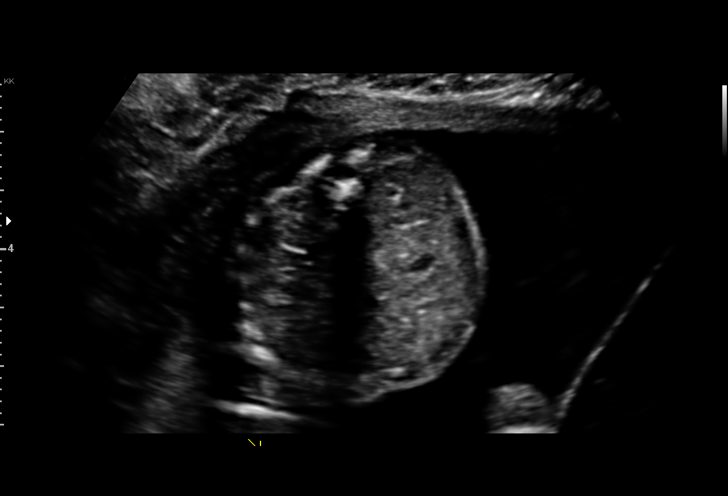
[im 71/191]
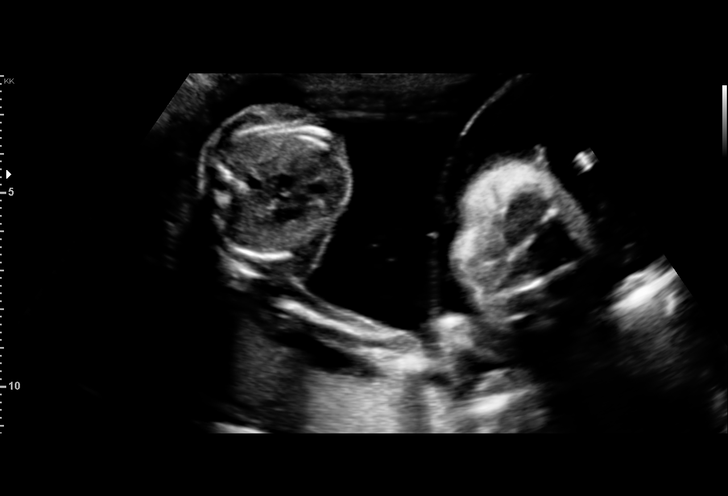
[im 85/191]
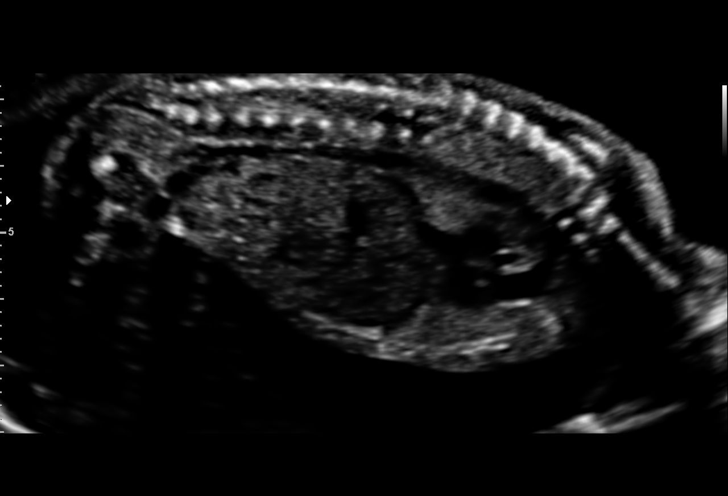
[im 106/191]
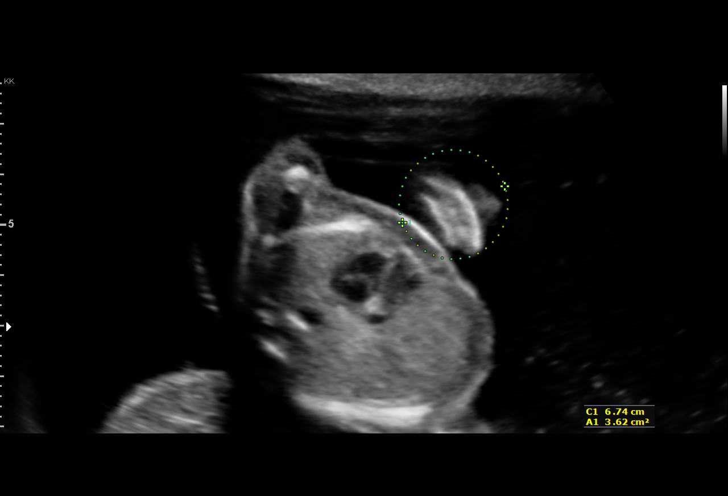
[im 120/191]
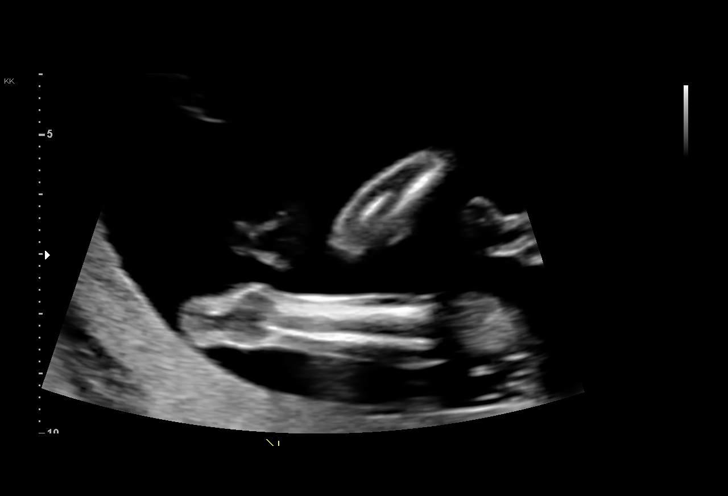
[im 134/191]
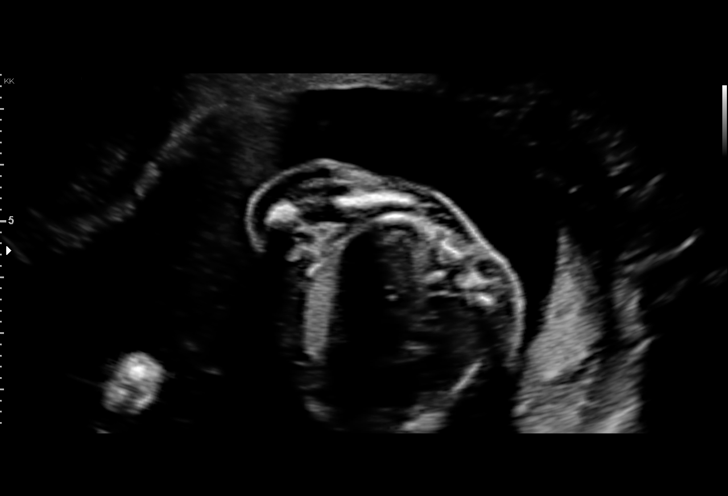
[im 155/191]
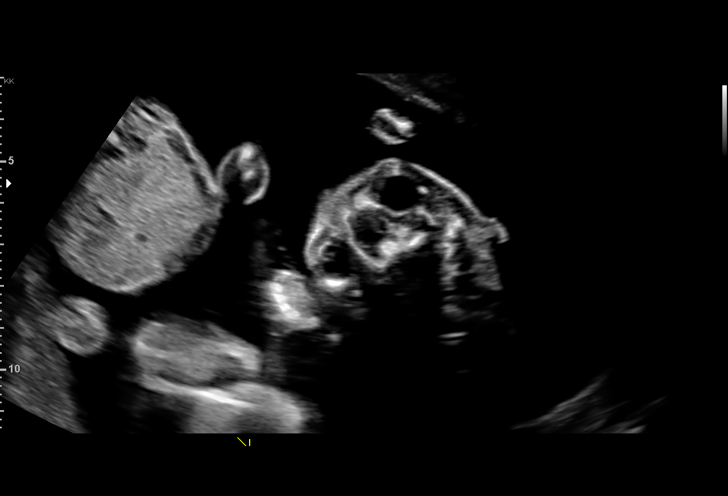
[im 169/191]
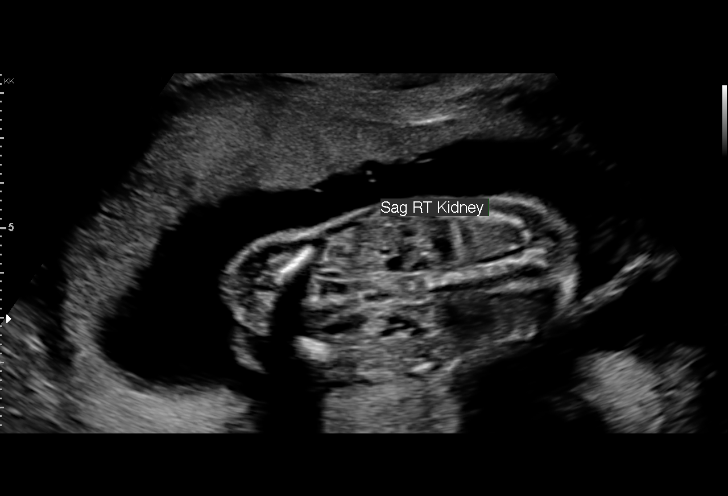
[im 183/191]
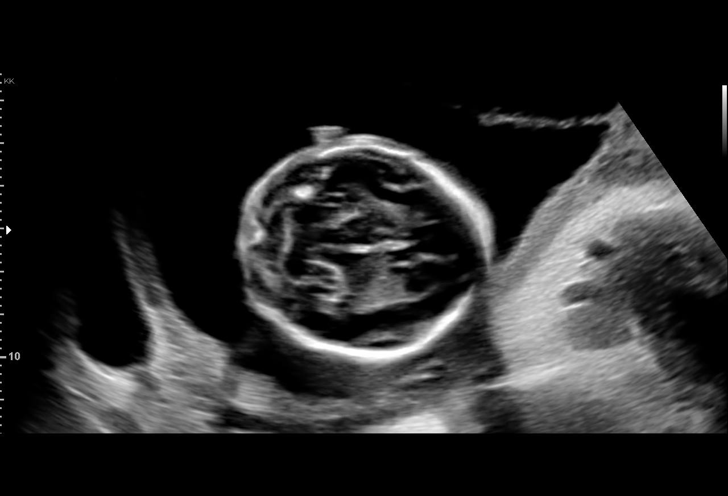

[12 of 28 positions shown; findings below may reference images not displayed]

WK

1  OHAYON DAIN          [id number]      [id number]     [id number]
2  OHAYON DAIN          [id number]      [id number]     [id number]
Indications

19 weeks gestation of pregnancy
Detailed fetal anatomic survey                 Z36
Twin pregnancy, di/di, second trimester
OB History

Blood Type:            Height:  5'1"   Weight (lb):  138      BMI:
Gravidity:    1
Fetal Evaluation (Fetus A)

Num Of Fetuses:     2
Fetal Heart         142
Rate(bpm):
Cardiac Activity:   Observed
Fetal Lie:          Maternal right side
Presentation:       Cephalic
Placenta:           Posterior, above cervical os
P. Cord Insertion:  Visualized
Membrane Desc:      Dividing Membrane seen - Dichorionic.

Amniotic Fluid
AFI FV:      Subjectively within normal limits

Largest Pocket(cm)
6.6
Biometry (Fetus A)

BPD:      48.1  mm     G. Age:  20w 4d         77  %    CI:        77.39   %   70 - 86
FL/HC:      18.8   %   16.8 -
HC:      173.1  mm     G. Age:  19w 6d         43  %    HC/AC:      1.23       1.09 -
AC:      141.2  mm     G. Age:  19w 4d         33  %    FL/BPD:     67.8   %
FL:       32.6  mm     G. Age:  20w 1d         54  %    FL/AC:      23.1   %   20 - 24
HUM:      29.3  mm     G. Age:  19w 4d         45  %

Est. FW:     316  gm    0 lb 11 oz      49  %     FW Discordancy         0  %
Gestational Age (Fetus A)

LMP:           19w 6d       Date:   03/21/16                 EDD:   12/26/16
U/S Today:     20w 0d                                        EDD:   12/25/16
Best:          19w 6d    Det. By:   LMP  (03/21/16)          EDD:   12/26/16
Anatomy (Fetus A)

Cranium:               Appears normal         Aortic Arch:            Not well visualized
Cavum:                 Appears normal         Ductal Arch:            Not well visualized
Ventricles:            Appears normal         Diaphragm:              Appears normal
Choroid Plexus:        Appears normal         Stomach:                Appears normal, left
sided
Cerebellum:            Appears normal         Abdomen:                Appears normal
Posterior Fossa:       Not well visualized    Abdominal Wall:         Appears nml (cord
insert, abd wall)
Nuchal Fold:           Not well visualized    Cord Vessels:           Appears normal (3
vessel cord)
Face:                  Appears normal         Kidneys:                Appear normal
(orbits and profile)
Lips:                  Appears normal         Bladder:                Appears normal
Thoracic:              Appears normal         Spine:                  Appears normal
Heart:                 Appears normal         Upper Extremities:      Appears normal
(4CH, axis, and situs
RVOT:                  Appears normal         Lower Extremities:      Appears normal
LVOT:                  Not well visualized

Other:  Female gender. Heels visualized. Technically difficult due to fetal
position.

Fetal Evaluation (Fetus B)

Num Of Fetuses:     2
Fetal Heart         140
Rate(bpm):
Cardiac Activity:   Observed
Fetal Lie:          Maternal left side
Presentation:       Variable
Placenta:           Posterior, above cervical os
P. Cord Insertion:  Not well visualized
Membrane Desc:      Dividing Membrane seen - Dichorionic.

Amniotic Fluid
AFI FV:      Subjectively within normal limits

Largest Pocket(cm)
5.6
Biometry (Fetus B)

BPD:      48.1  mm     G. Age:  20w 4d         77  %    CI:        80.87   %   70 - 86
FL/HC:      19.1   %   16.8 -
HC:      168.9  mm     G. Age:  19w 4d         27  %    HC/AC:      1.17       1.09 -
AC:      143.8  mm     G. Age:  19w 5d         40  %    FL/BPD:     66.9   %
FL:       32.2  mm     G. Age:  20w 0d         49  %    FL/AC:      22.4   %   20 - 24
HUM:      29.8  mm     G. Age:  19w 6d         51  %
NFT:       3.3  mm

Est. FW:     317  gm    0 lb 11 oz      49  %     FW Discordancy      0 \ 0 %
Gestational Age (Fetus B)

LMP:           19w 6d       Date:   03/21/16                 EDD:   12/26/16
U/S Today:     20w 0d                                        EDD:   12/25/16
Best:          19w 6d    Det. By:   LMP  (03/21/16)          EDD:   12/26/16
Anatomy (Fetus B)

Cranium:               Appears normal         Aortic Arch:            Not well visualized
Cavum:                 Appears normal         Ductal Arch:            Not well visualized
Ventricles:            Appears normal         Diaphragm:              Appears normal
Choroid Plexus:        Appears normal         Stomach:                Appears normal, left
sided
Cerebellum:            Appears normal         Abdomen:                Appears normal
Posterior Fossa:       Appears normal         Abdominal Wall:         Appears nml (cord
insert, abd wall)
Nuchal Fold:           Appears normal         Cord Vessels:           Appears normal (3
vessel cord)
Face:                  Appears normal         Kidneys:                Appear normal
(orbits and profile)
Lips:                  Appears normal         Bladder:                Appears normal
Thoracic:              Appears normal         Spine:                  Appears normal
Heart:                 Appears normal         Upper Extremities:      Appears normal
(4CH, axis, and
situs)
RVOT:                  Appears normal         Lower Extremities:      Appears normal
LVOT:                  Appears normal

Other:  Heels visualized. Female gender. Technically difficult due to fetal
position.
Cervix Uterus Adnexa

Cervix
Length:            3.8  cm.
Normal appearance by transabdominal scan.
Impression

DC/DA twin gestation at 19w 6d

Twin A:
Maternal right, cephalic presentation, posterior placenta
Limited views of the fetal heart, posterior fossa obtained
The remainder of the fetal anatomy appears normal
Normal amniotic fluid volume

Twin B:
Maternal left, variable presentation, posterior placenta
Limited views of the aortic / ductal arcehs obtained
The remainder of the fetal anatomy appears normal
Normal amniotic fluid volume
Recommendations

Recommend follow-up ultrasound examination in 4 weeks for
growth and to complete anatomy

## 2018-04-25 IMAGING — US US MFM OB FOLLOW-UP EACH ADDL GEST (MODIFY)
1 series · 16 of 28 positions shown · non-contrast
Comparison: none

[Series 1: us mfm ob follow-up each addl gest (modify) · 16 of 71 slices shown]
[im 1/71]
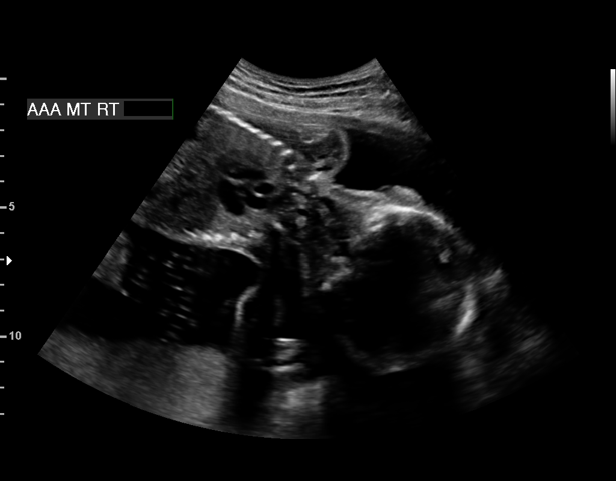
[im 6/71]
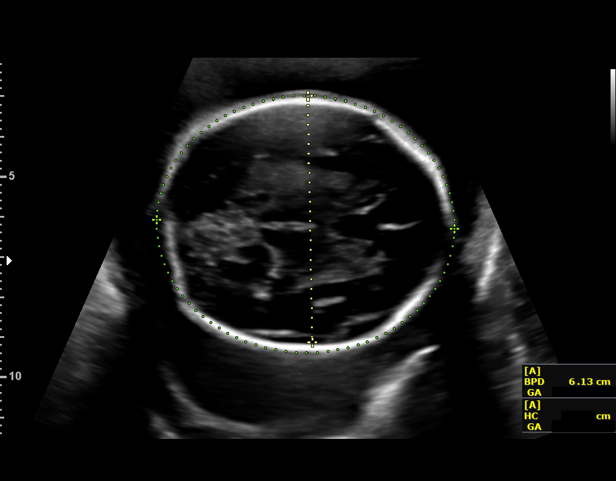
[im 11/71]
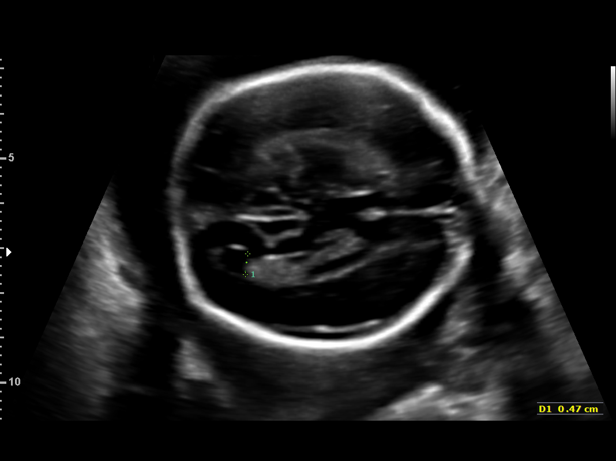
[im 16/71]
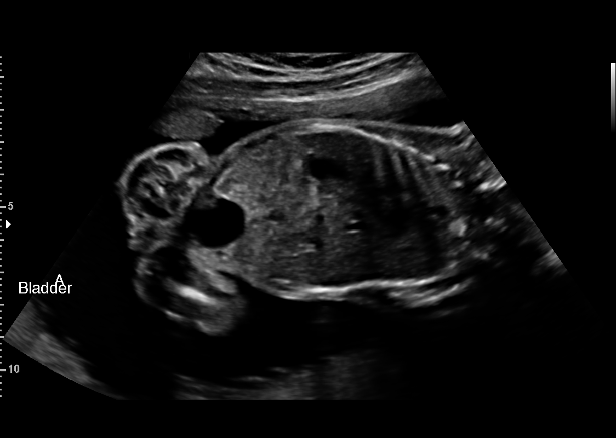
[im 19/71]
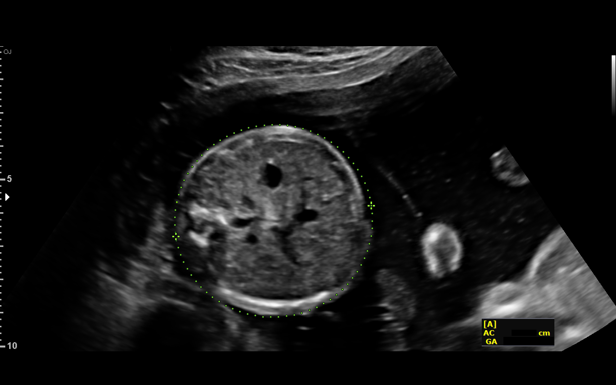
[im 24/71]
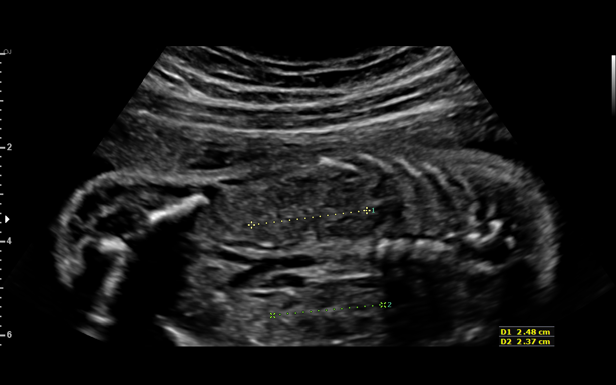
[im 29/71]
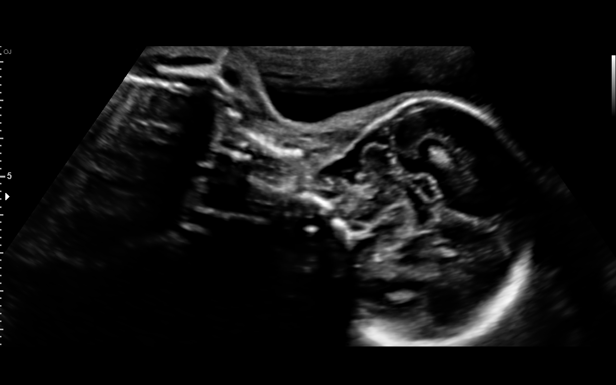
[im 34/71]
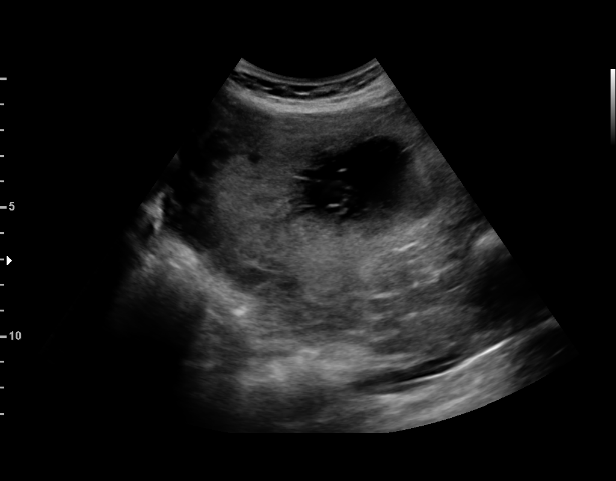
[im 37/71]
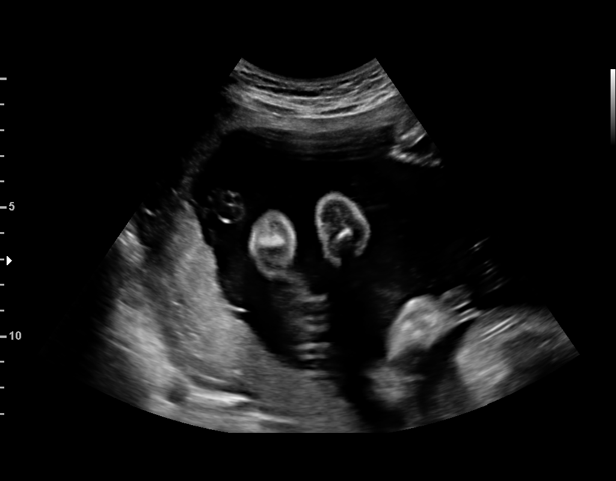
[im 42/71]
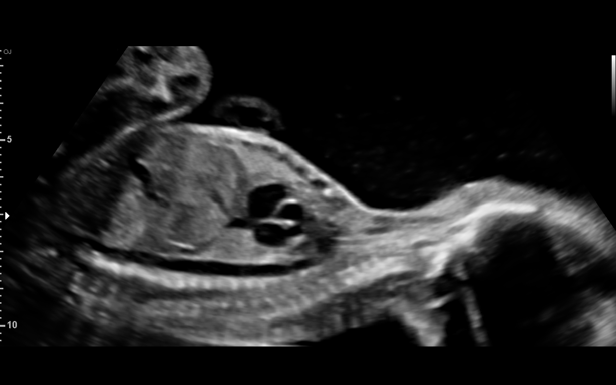
[im 47/71]
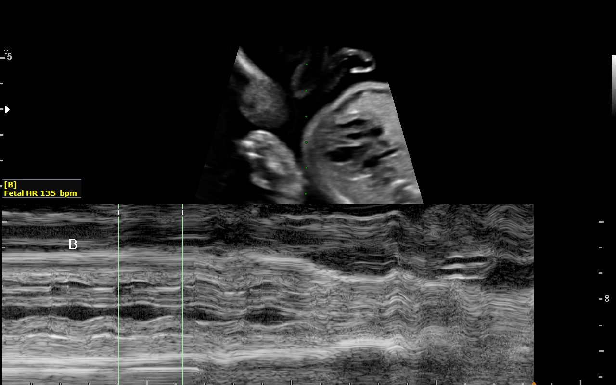
[im 52/71]
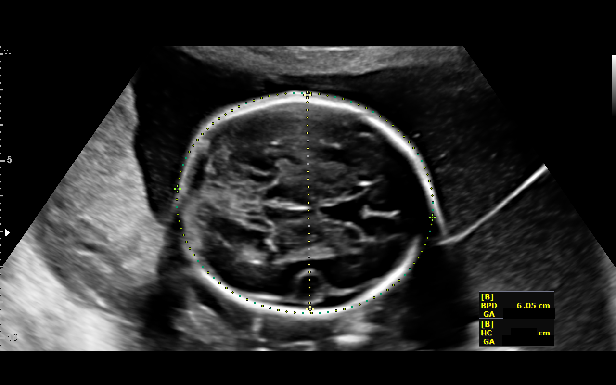
[im 55/71]
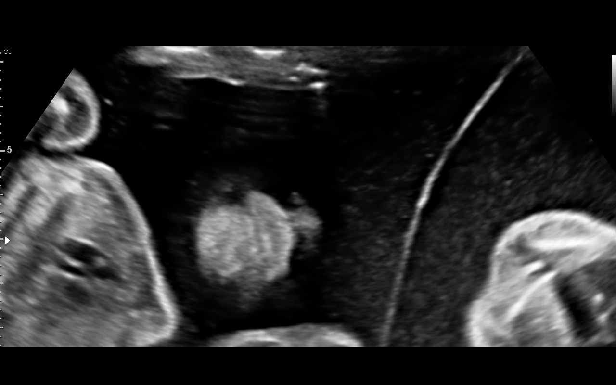
[im 60/71]
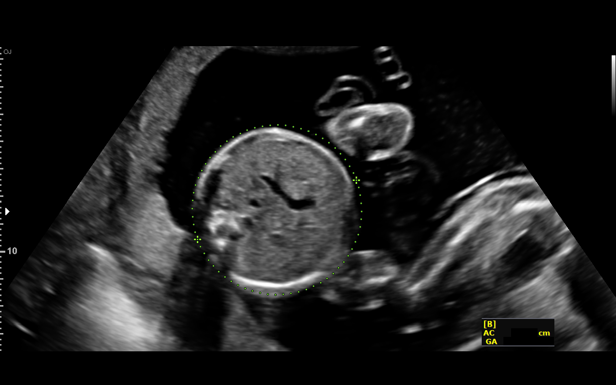
[im 65/71]
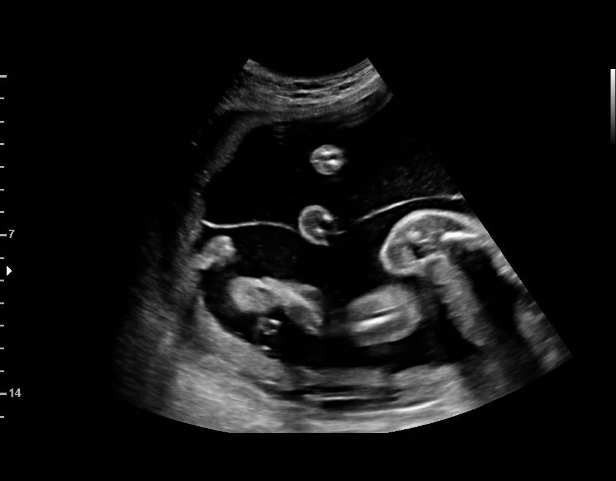
[im 71/71]
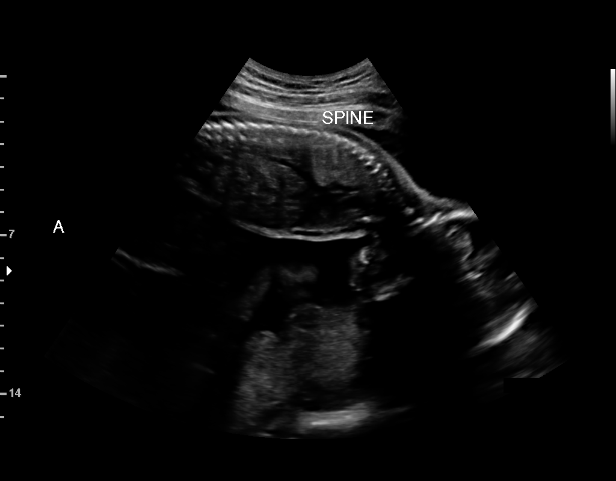

[16 of 28 positions shown; findings below may reference images not displayed]

1  ANNE MARIJE MARBUS            [id number]      [id number]     [id number]
2  ANNE MARIJE MARBUS            [id number]      [id number]     [id number]
Indications

23 weeks gestation of pregnancy
Encounter for other antenatal screening
follow-up
Twin pregnancy, di/di, second trimester
OB History

Blood Type:            Height:  5'1"   Weight (lb):  138      BMI:
Gravidity:    1
Fetal Evaluation (Fetus A)

Num Of Fetuses:     2
Fetal Heart         164
Rate(bpm):
Cardiac Activity:   Observed
Fetal Lie:          Maternal right side
Presentation:       Cephalic
Placenta:           Posterior, above cervical os
P. Cord Insertion:  Visualized
Membrane Desc:      Dividing Membrane seen

Amniotic Fluid
AFI FV:      Subjectively within normal limits

Largest Pocket(cm)
6.8
Biometry (Fetus A)

BPD:      61.3  mm     G. Age:  24w 6d         80  %    CI:        79.83   %   70 - 86
FL/HC:      20.1   %   18.7 -
HC:      216.8  mm     G. Age:  23w 5d         28  %    HC/AC:      1.17       1.05 -
AC:      185.1  mm     G. Age:  23w 2d         25  %    FL/BPD:     71.0   %   71 - 87
FL:       43.5  mm     G. Age:  24w 2d         51  %    FL/AC:      23.5   %   20 - 24
HUM:      39.6  mm     G. Age:  24w 1d         49  %
CER:      27.8  mm     G. Age:  24w 6d         73  %
CM:        4.8  mm

Est. FW:     629  gm      1 lb 6 oz     51  %     FW Discordancy         5  %
Gestational Age (Fetus A)

LMP:           23w 6d       Date:   03/21/16                 EDD:   12/26/16
U/S Today:     24w 0d                                        EDD:   12/25/16
Best:          23w 6d    Det. By:   LMP  (03/21/16)          EDD:   12/26/16
Anatomy (Fetus A)

Cranium:               Appears normal         Aortic Arch:            Appears normal
Cavum:                 Appears normal         Ductal Arch:            Appears normal
Ventricles:            Appears normal         Diaphragm:              Appears normal
Choroid Plexus:        Previously seen        Stomach:                Appears normal, left
sided
Cerebellum:            Appears normal         Abdomen:                Appears normal
Posterior Fossa:       Appears normal         Abdominal Wall:         Appears nml (cord
insert, abd wall)
Nuchal Fold:           Not applicable (>20    Cord Vessels:           Appears normal (3
wks GA)                                        vessel cord)
Face:                  Orbits and profile     Kidneys:                Appear normal
previously seen
Lips:                  Appears normal         Bladder:                Appears normal
Thoracic:              Appears normal         Spine:                  Previously seen
Heart:                 Previously seen        Upper Extremities:      Previously seen
RVOT:                  Appears normal         Lower Extremities:      Previously seen
LVOT:                  Appears normal

Other:  Female gender. Heels prev. visualized. Technically difficult due to
fetal position.

Fetal Evaluation (Fetus B)

Num Of Fetuses:     2
Fetal Heart         135
Rate(bpm):
Cardiac Activity:   Observed
Fetal Lie:          Maternal left side
Presentation:       Cephalic
Placenta:           Posterior, above cervical os
P. Cord Insertion:  Visualized
Membrane Desc:      Dividing Membrane seen

Amniotic Fluid
AFI FV:      Subjectively within normal limits

Largest Pocket(cm)
7.0
Biometry (Fetus B)

BPD:      60.3  mm     G. Age:  24w 4d         70  %    CI:        79.47   %   70 - 86
FL/HC:      19.8   %   18.7 -
HC:      213.8  mm     G. Age:  23w 3d         20  %    HC/AC:      1.08       1.05 -
AC:      197.8  mm     G. Age:  24w 3d         60  %    FL/BPD:     70.3   %   71 - 87
FL:       42.4  mm     G. Age:  23w 6d         37  %    FL/AC:      21.4   %   20 - 24
HUM:      40.8  mm     G. Age:  24w 5d         62  %
CER:      26.9  mm     G. Age:  24w 3d         62  %

CM:        5.3  mm

Est. FW:     664  gm      1 lb 7 oz     57  %     FW Discordancy      0 \ 5 %
Gestational Age (Fetus B)

LMP:           23w 6d       Date:   03/21/16                 EDD:   12/26/16
U/S Today:     24w 1d                                        EDD:   12/24/16
Best:          23w 6d    Det. By:   LMP  (03/21/16)          EDD:   12/26/16
Anatomy (Fetus B)

Cranium:               Appears normal         Aortic Arch:            Appears normal
Cavum:                 Appears normal         Ductal Arch:            Appears normal
Ventricles:            Appears normal         Diaphragm:              Appears normal
Choroid Plexus:        Appears normal         Stomach:                Appears normal, left
sided
Cerebellum:            Appears normal         Abdomen:                Appears normal
Posterior Fossa:       Appears normal         Abdominal Wall:         Appears nml (cord
insert, abd wall)
Nuchal Fold:           Previously seen        Cord Vessels:           Appears normal (3
vessel cord)
Face:                  Appears normal         Kidneys:                Appear normal
(orbits and profile)
Lips:                  Appears normal         Bladder:                Appears normal
Thoracic:              Appears normal         Spine:                  Previously seen
Heart:                 Appears normal         Upper Extremities:      Previously seen
(4CH, axis, and
situs)
RVOT:                  Appears normal         Lower Extremities:      Previously seen
LVOT:                  Appears normal

Other:  Heels prev. visualized. Female gender. Technically difficult due to
fetal position.
Cervix Uterus Adnexa

Cervix
Length:            3.8  cm.
Normal appearance by transabdominal scan.

Uterus
No abnormality visualized.
Impression

DC/DA twin gestation at 23w 6d

Twin A:
Maternal right, cephalic presentation, posterior placenta
Normal interval anatomy
The estimated fetal weight today is at the 51st %tile.
Normal amniotic fluid volume

Twin B:
Maternal left, cephalic presentation, posterior placenta
Normal interval anatomy
The estimated fetal weight is a the 57th %tile
Normal amniotic fluid volume
Recommendations

Recommend follow-up ultrasound examination in 4 weeks for
growth

## 2018-08-03 IMAGING — US US MFM OB LIMITED
1 series · 15 of 15 positions shown · non-contrast
Comparison: none

[Series 1: us mfm ob limited · 15 of 15 slices shown]
[im 1/15]
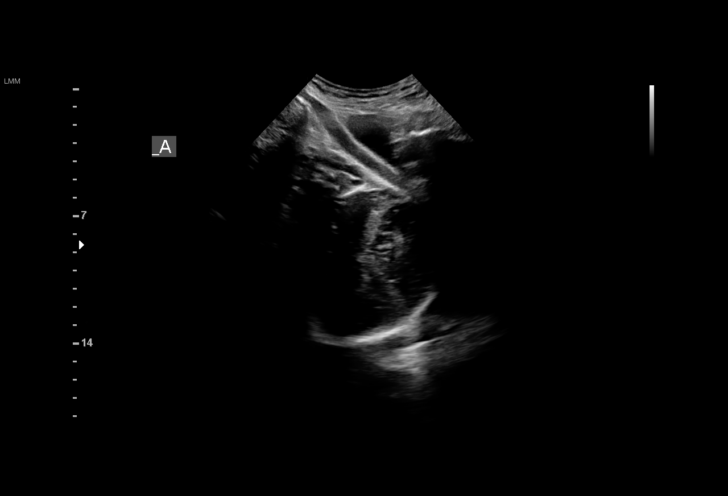
[im 2/15]
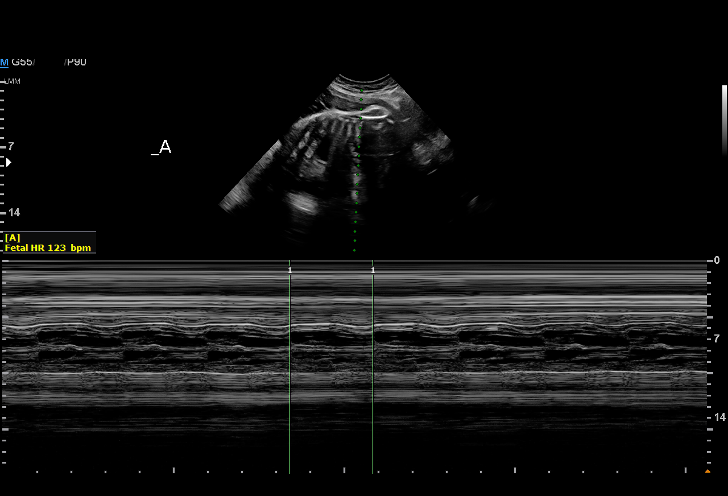
[im 3/15]
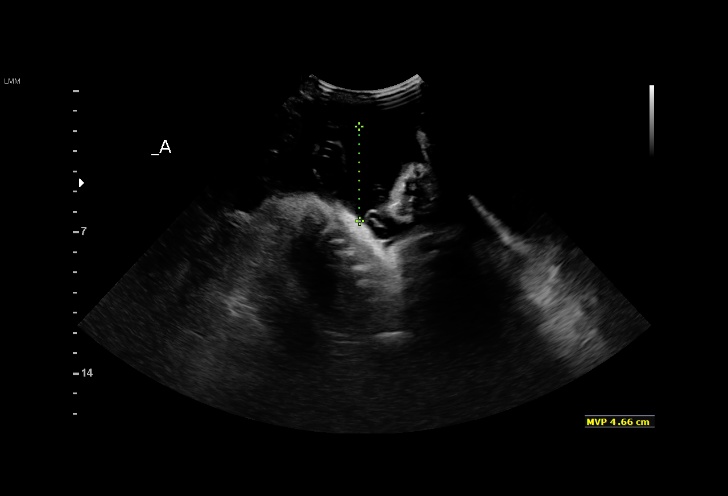
[im 4/15]
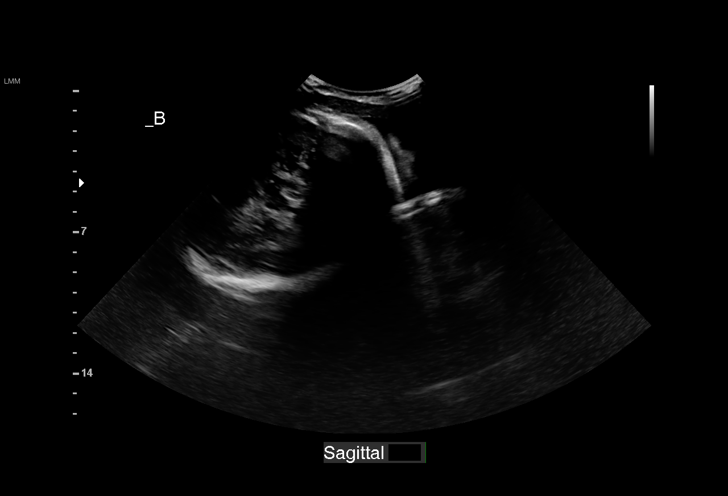
[im 5/15]
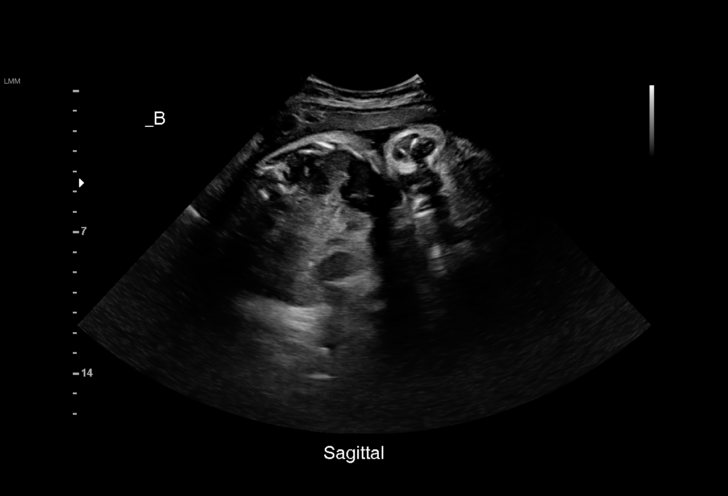
[im 6/15]
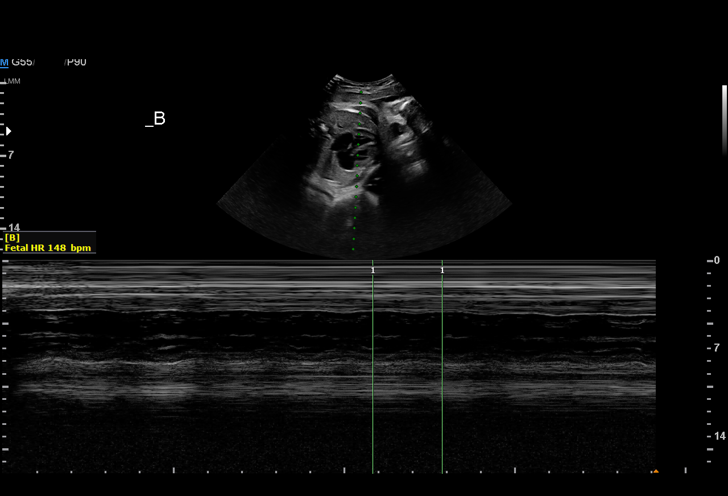
[im 7/15]
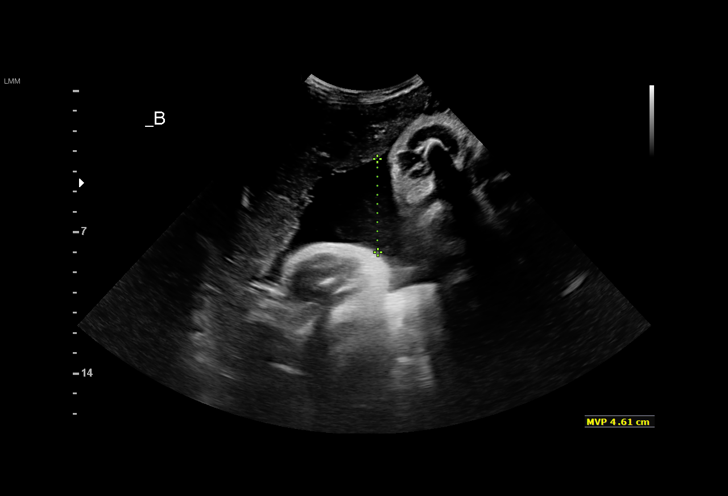
[im 8/15]
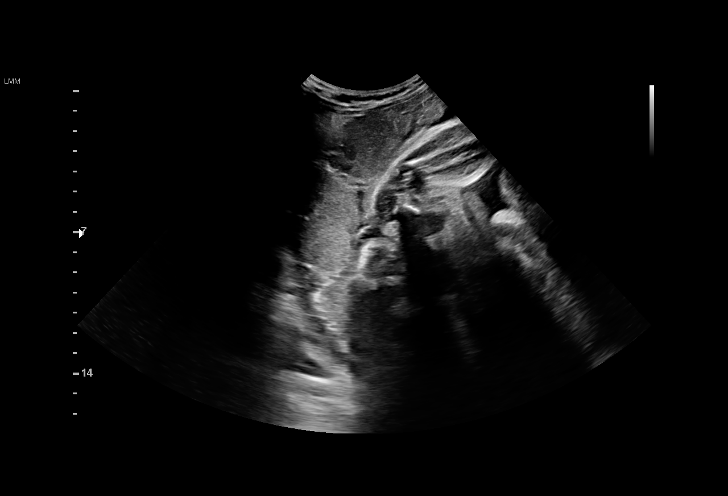
[im 9/15]
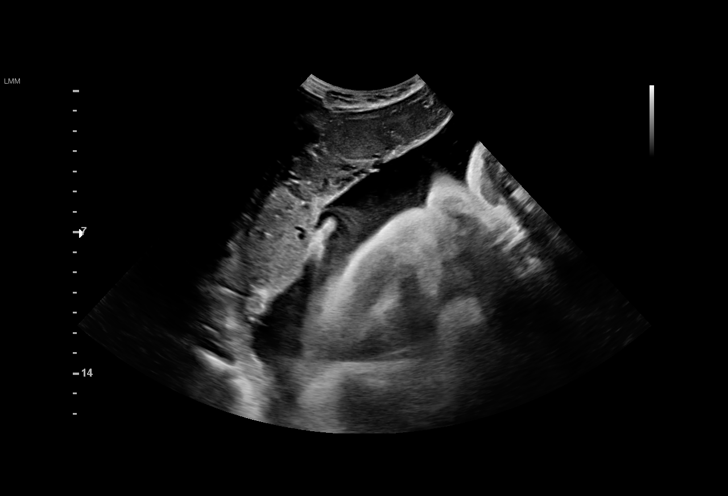
[im 10/15]
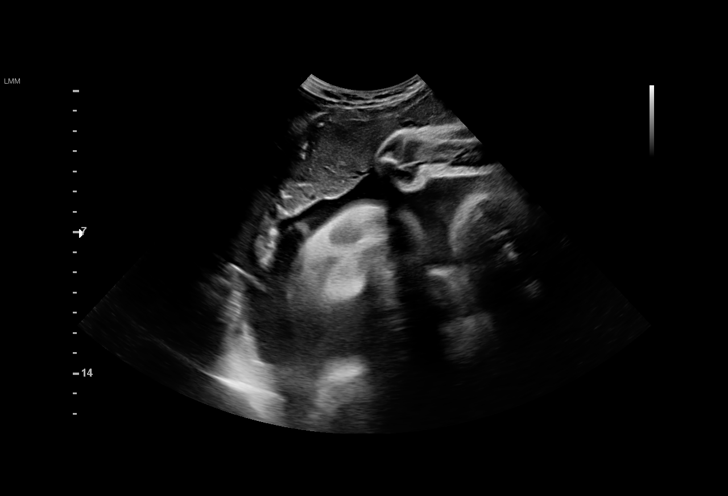
[im 11/15]
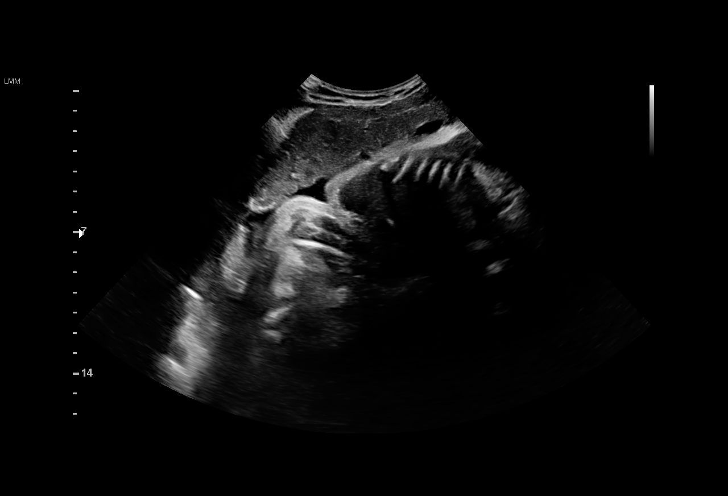
[im 12/15]
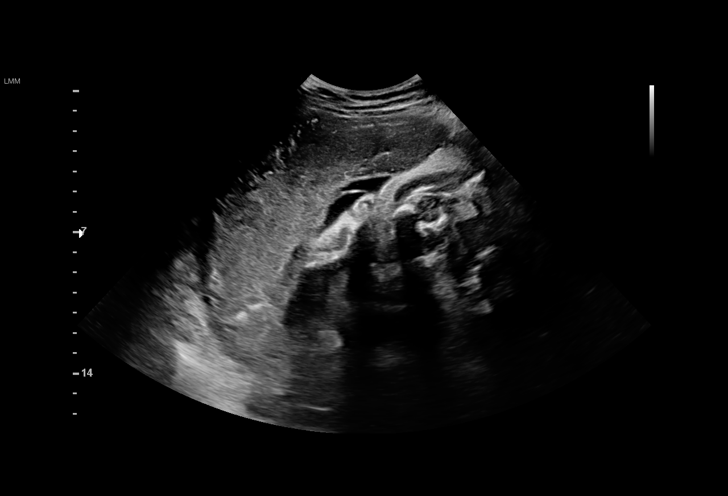
[im 13/15]
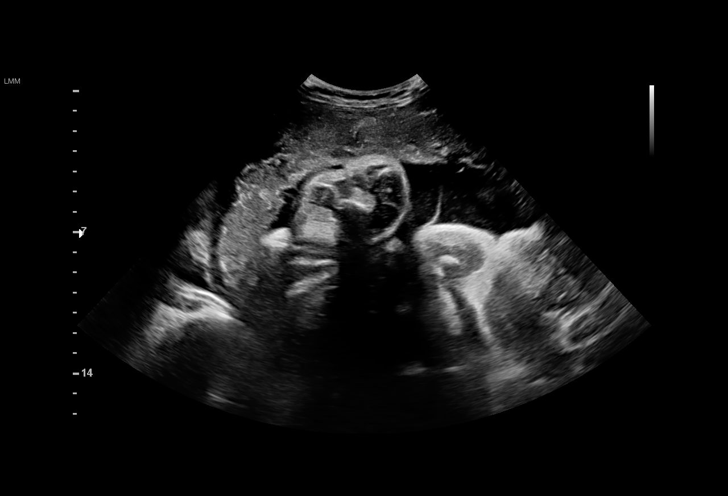
[im 14/15]
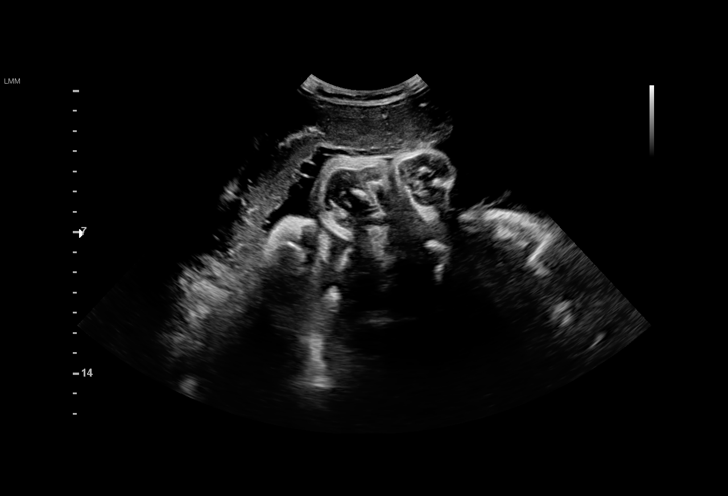
[im 15/15]
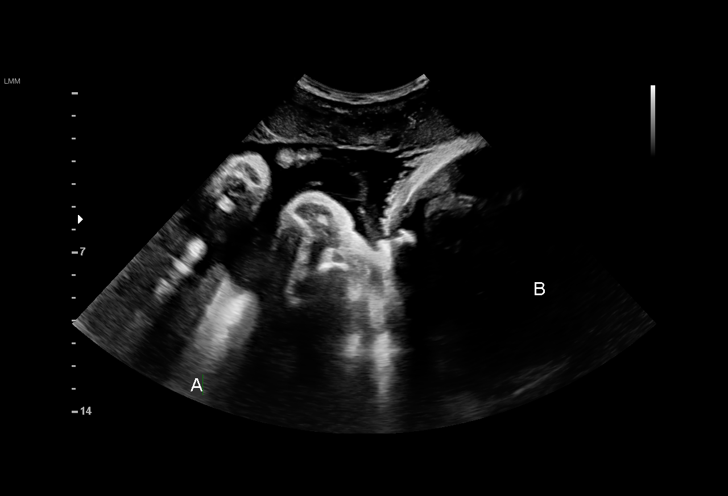

[15 of 15 positions shown; findings below may reference images not displayed]

[REDACTED]7

1  [place] JAVIER HUANGA             [id number]      [id number]     [id number]
Indications

38 weeks gestation of pregnancy
Determine Fetal presentation by ultrasound
Twin pregnancy, di/di, third trimester
OB History

Blood Type:            Height:  5'1"   Weight (lb):  138      BMI:
Gravidity:    1
Fetal Evaluation (Fetus A)

Num Of Fetuses:     2
Fetal Heart         123
Rate(bpm):
Cardiac Activity:   Observed
Fetal Lie:          Maternal right side
Presentation:       Cephalic
Placenta:           Fundal, above cervical os
Membrane Desc:      Dividing Membrane seen

Amniotic Fluid
AFI FV:      Subjectively within normal limits

Largest Pocket(cm)
4.66
Gestational Age (Fetus A)

LMP:           38w 1d       Date:   03/21/16                 EDD:   12/26/16
Best:          38w 1d    Det. By:   LMP  (03/21/16)          EDD:   12/26/16
Fetal Evaluation (Fetus B)

Num Of Fetuses:     2
Fetal Heart         148
Rate(bpm):
Cardiac Activity:   Observed
Fetal Lie:          Maternal left side
Presentation:       Cephalic
Placenta:           Anterior, above cervical os
Membrane Desc:      Dividing Membrane seen

Amniotic Fluid
AFI FV:      Subjectively within normal limits

Largest Pocket(cm)
4.61
Gestational Age (Fetus B)

LMP:           38w 1d       Date:   03/21/16                 EDD:   12/26/16
Best:          38w 1d    Det. By:   LMP  (03/21/16)          EDD:   12/26/16
Impression

Dichorionic/diamniotic twin pregnancy at 38+1 weeks
Cephalic/cephalic presentation
Normal amniotic fluid volume x 2
Recommendations

Follow-up as clinically indicated

## 2022-12-15 ENCOUNTER — Telehealth: Payer: Self-pay | Admitting: Emergency Medicine

## 2022-12-15 ENCOUNTER — Ambulatory Visit
Admission: EM | Admit: 2022-12-15 | Discharge: 2022-12-15 | Discharge status: Against Medical Advice | Payer: Medicaid Other

## 2022-12-15 NOTE — Telephone Encounter (Signed)
Call to an on my way patient requesting a call back - # left on voice mail - reason for visit was a concussion - pt instructed to go to ED if pt had a concussion
